# Patient Record
Sex: Male | Born: 1970 | Race: Black or African American | Hispanic: No | Marital: Married | Smoking: Current every day smoker
Health system: Southern US, Community
[De-identification: ages and names within clinical notes are randomized; demographics above are authoritative.]

## PROBLEM LIST (undated history)

## (undated) DIAGNOSIS — Z789 Other specified health status: Secondary | ICD-10-CM

## (undated) HISTORY — PX: NO PAST SURGERIES: SHX2092

---

## 2008-06-18 ENCOUNTER — Emergency Department (HOSPITAL_COMMUNITY): Admission: EM | Admit: 2008-06-18 | Discharge: 2008-06-18 | Payer: Self-pay | Admitting: Family Medicine

## 2008-08-18 ENCOUNTER — Emergency Department (HOSPITAL_COMMUNITY): Admission: EM | Admit: 2008-08-18 | Discharge: 2008-08-18 | Payer: Self-pay | Admitting: Emergency Medicine

## 2008-10-29 ENCOUNTER — Emergency Department (HOSPITAL_COMMUNITY): Admission: EM | Admit: 2008-10-29 | Discharge: 2008-10-29 | Payer: Self-pay | Admitting: Emergency Medicine

## 2008-11-01 ENCOUNTER — Emergency Department (HOSPITAL_COMMUNITY): Admission: EM | Admit: 2008-11-01 | Discharge: 2008-11-01 | Payer: Self-pay | Admitting: Emergency Medicine

## 2009-02-25 ENCOUNTER — Emergency Department (HOSPITAL_COMMUNITY): Admission: EM | Admit: 2009-02-25 | Discharge: 2009-02-25 | Payer: Self-pay | Admitting: Emergency Medicine

## 2009-03-22 ENCOUNTER — Ambulatory Visit: Payer: Self-pay | Admitting: Internal Medicine

## 2009-04-24 ENCOUNTER — Ambulatory Visit: Payer: Self-pay | Admitting: Internal Medicine

## 2009-04-24 LAB — CONVERTED CEMR LAB
Alkaline Phosphatase: 75 units/L (ref 39–117)
Amphetamine Screen, Ur: NEGATIVE
Barbiturate Quant, Ur: NEGATIVE
Basophils Absolute: 0 10*3/uL (ref 0.0–0.1)
Basophils Relative: 1 % (ref 0–1)
CO2: 23 meq/L (ref 19–32)
Cholesterol: 168 mg/dL (ref 0–200)
Cocaine Metabolites: NEGATIVE
Creatinine, Ser: 1.22 mg/dL (ref 0.40–1.50)
Eosinophils Absolute: 0.3 10*3/uL (ref 0.0–0.7)
Glucose, Bld: 98 mg/dL (ref 70–99)
LDL Cholesterol: 105 mg/dL — ABNORMAL HIGH (ref 0–99)
MCHC: 33.5 g/dL (ref 30.0–36.0)
MCV: 87.8 fL (ref 78.0–100.0)
Marijuana Metabolite: NEGATIVE
Methadone: NEGATIVE
Monocytes Absolute: 0.4 10*3/uL (ref 0.1–1.0)
Monocytes Relative: 7 % (ref 3–12)
Neutro Abs: 2.4 10*3/uL (ref 1.7–7.7)
Neutrophils Relative %: 39 % — ABNORMAL LOW (ref 43–77)
Opiate Screen, Urine: NEGATIVE
RBC: 4.83 M/uL (ref 4.22–5.81)
RDW: 13.6 % (ref 11.5–15.5)
Sodium: 138 meq/L (ref 135–145)
Total Bilirubin: 0.3 mg/dL (ref 0.3–1.2)
Total CHOL/HDL Ratio: 3.5
Triglycerides: 75 mg/dL (ref <150)
VLDL: 15 mg/dL (ref 0–40)

## 2009-06-07 ENCOUNTER — Emergency Department (HOSPITAL_COMMUNITY): Admission: EM | Admit: 2009-06-07 | Discharge: 2009-06-07 | Payer: Self-pay | Admitting: Emergency Medicine

## 2009-06-30 ENCOUNTER — Ambulatory Visit: Payer: Self-pay | Admitting: Internal Medicine

## 2009-07-04 ENCOUNTER — Ambulatory Visit: Payer: Self-pay | Admitting: *Deleted

## 2009-09-25 ENCOUNTER — Emergency Department (HOSPITAL_COMMUNITY): Admission: EM | Admit: 2009-09-25 | Discharge: 2009-09-25 | Payer: Self-pay | Admitting: Family Medicine

## 2009-10-13 ENCOUNTER — Ambulatory Visit: Payer: Self-pay | Admitting: Internal Medicine

## 2011-01-01 LAB — CBC
HCT: 45.8 % (ref 39.0–52.0)
Hemoglobin: 16.1 g/dL (ref 13.0–17.0)
MCHC: 35.1 g/dL (ref 30.0–36.0)
MCV: 87 fL (ref 78.0–100.0)
RDW: 13.3 % (ref 11.5–15.5)

## 2011-01-01 LAB — RAPID STREP SCREEN (MED CTR MEBANE ONLY): Streptococcus, Group A Screen (Direct): POSITIVE — AB

## 2011-01-01 LAB — DIFFERENTIAL
Basophils Absolute: 0.1 10*3/uL (ref 0.0–0.1)
Eosinophils Absolute: 0.2 10*3/uL (ref 0.0–0.7)
Eosinophils Relative: 2 % (ref 0–5)
Lymphocytes Relative: 21 % (ref 12–46)
Monocytes Absolute: 1 10*3/uL (ref 0.1–1.0)

## 2011-01-03 ENCOUNTER — Emergency Department (HOSPITAL_COMMUNITY)
Admission: EM | Admit: 2011-01-03 | Discharge: 2011-01-03 | Disposition: A | Discharge status: Home / Self Care | Payer: Self-pay | Attending: Emergency Medicine | Admitting: Emergency Medicine

## 2011-01-03 DIAGNOSIS — R0982 Postnasal drip: Secondary | ICD-10-CM | POA: Insufficient documentation

## 2011-01-03 DIAGNOSIS — J329 Chronic sinusitis, unspecified: Secondary | ICD-10-CM | POA: Insufficient documentation

## 2011-01-03 DIAGNOSIS — J3489 Other specified disorders of nose and nasal sinuses: Secondary | ICD-10-CM | POA: Insufficient documentation

## 2011-04-25 ENCOUNTER — Inpatient Hospital Stay (INDEPENDENT_AMBULATORY_CARE_PROVIDER_SITE_OTHER)
Admission: RE | Admit: 2011-04-25 | Discharge: 2011-04-25 | Disposition: A | Discharge status: Home / Self Care | Payer: Self-pay | Source: Ambulatory Visit | Attending: Emergency Medicine | Admitting: Emergency Medicine

## 2011-04-25 DIAGNOSIS — K089 Disorder of teeth and supporting structures, unspecified: Secondary | ICD-10-CM

## 2011-04-25 DIAGNOSIS — K069 Disorder of gingiva and edentulous alveolar ridge, unspecified: Secondary | ICD-10-CM

## 2011-08-17 ENCOUNTER — Encounter: Payer: Self-pay | Admitting: Emergency Medicine

## 2011-08-17 ENCOUNTER — Emergency Department (HOSPITAL_COMMUNITY)
Admission: EM | Admit: 2011-08-17 | Discharge: 2011-08-17 | Disposition: A | Discharge status: Home / Self Care | Payer: Self-pay | Attending: Emergency Medicine | Admitting: Emergency Medicine

## 2011-08-17 DIAGNOSIS — J029 Acute pharyngitis, unspecified: Secondary | ICD-10-CM | POA: Insufficient documentation

## 2011-08-17 DIAGNOSIS — J3489 Other specified disorders of nose and nasal sinuses: Secondary | ICD-10-CM | POA: Insufficient documentation

## 2011-08-17 DIAGNOSIS — R42 Dizziness and giddiness: Secondary | ICD-10-CM | POA: Insufficient documentation

## 2011-08-17 LAB — POCT I-STAT, CHEM 8
Creatinine, Ser: 1.2 mg/dL (ref 0.50–1.35)
Glucose, Bld: 97 mg/dL (ref 70–99)
HCT: 45 % (ref 39.0–52.0)
Hemoglobin: 15.3 g/dL (ref 13.0–17.0)
Sodium: 140 mEq/L (ref 135–145)
TCO2: 26 mmol/L (ref 0–100)

## 2011-08-17 LAB — CBC
Hemoglobin: 14.5 g/dL (ref 13.0–17.0)
MCH: 30.5 pg (ref 26.0–34.0)
MCHC: 34.8 g/dL (ref 30.0–36.0)
RDW: 12.9 % (ref 11.5–15.5)

## 2011-08-17 MED ORDER — IBUPROFEN 800 MG PO TABS
800.0000 mg | ORAL_TABLET | Freq: Once | ORAL | Status: AC
  Administered 2011-08-17: 800 mg via ORAL
  Filled 2011-08-17: qty 1

## 2011-08-17 MED ORDER — AMOXICILLIN 500 MG PO CAPS: 500.0000 mg | ORAL_CAPSULE | Freq: Three times a day (TID) | ORAL | Status: AC

## 2011-08-17 NOTE — ED Provider Notes (Signed)
Medical screening examination/treatment/procedure(s) were performed by non-physician practitioner and as supervising physician I was immediately available for consultation/collaboration.    Nelia Shi, MD 08/17/11 2028

## 2011-08-17 NOTE — ED Provider Notes (Signed)
History     CSN: [id number] Arrival date & time: 08/17/2011  9:52 AM   First MD Initiated Contact with Patient 08/17/11 1006      Chief Complaint  Patient presents with  . Sore Throat    (Consider location/radiation/quality/duration/timing/severity/associated sxs/prior treatment) HPI  Patient presents to emergency department complaining of a two-week history of gradual onset sore throat describing throat as painful and scratching with right-sided throat more painful than left. Patient states he has history of a "throat abscess" approximately 2 years ago that had to be "drained by ENT." Patient states this is his biggest concern that he could be "forming another abscess." Patient denies fevers, chills, headache, difficulty swallowing, earache, cough. Patient does note some sinus pressure and nasal drainage as well as a two-week history of intermittent lightheadedness and dizziness. Patient states he can be walking at work and get a sensation of "swimmy headedness." Patient denies syncope, loss of coordination, difficulty ambulating, visual changes, extremity weakness, extremity numbness or tingling, slurred speech. Patient has no known medical problems and takes no medicines on a regular basis. Patient states that with history of abscess he had high fevers and high degree of pain and denies any similar symptoms currently but states "my throat feels irritated where the abscess was."  History reviewed. No pertinent past medical history.  History reviewed. No pertinent past surgical history.  History reviewed. No pertinent family history.  History  Substance Use Topics  . Smoking status: Current Everyday Smoker  . Smokeless tobacco: Never Used  . Alcohol Use: Yes     seldom      Review of Systems  All other systems reviewed and are negative.    Allergies  Review of patient's allergies indicates no known allergies.  Home Medications   Current Outpatient Rx  Name Route Sig  Dispense Refill  . IBUPROFEN 200 MG PO TABS Oral Take 200 mg by mouth daily as needed. For pain.       BP 108/66  Pulse 80  Temp(Src) 97.9 F (36.6 C) (Oral)  Resp 18  SpO2 98%  Physical Exam  Vitals reviewed. Constitutional: He is oriented to person, place, and time. He appears well-developed and well-nourished. No distress.  HENT:  Head: Normocephalic and atraumatic.  Right Ear: External ear normal.  Left Ear: External ear normal.  Nose: Nose normal.  Mouth/Throat: No oropharyngeal exudate.       Mild erythema of posterior pharynx and tonsils no tonsillar exudate or enlargement. Patent airway. Swallowing secretions well. Posterior pharynx cobbling and drainage but uvula midline and no tonsillar swelling.   Eyes: Conjunctivae and EOM are normal. Pupils are equal, round, and reactive to light.  Neck: Normal range of motion. Neck supple.  Cardiovascular: Normal rate, regular rhythm and normal heart sounds.  Exam reveals no gallop and no friction rub.   No murmur heard. Pulmonary/Chest: Effort normal and breath sounds normal. No respiratory distress. He has no wheezes. He has no rales. He exhibits no tenderness.  Abdominal: Soft. He exhibits no distension and no mass. There is no tenderness. There is no rebound and no guarding.  Musculoskeletal: Normal range of motion.  Lymphadenopathy:    He has no cervical adenopathy.  Neurological: He is alert and oriented to person, place, and time. He has normal reflexes. No cranial nerve deficit. Coordination normal.  Skin: Skin is warm and dry. No rash noted. He is not diaphoretic.  Psychiatric: He has a normal mood and affect.    ED Course  Procedures (including critical care time)  PO ibuprofen  Reviewed prior CT with patient noting that his abscess was left sided and not right where patient states his current discomfort is located and patient agrees to remembering episode incorrectly and that peritonsillar abscess was on left. Again  denies fevers, chills, difficulty swallowing or severe pain.   Strep screen negative   Date: 08/17/2011  Rate: 51  Rhythm: sinus bradycardia and sinus arrhythmia  QRS Axis: normal  Intervals: normal  ST/T Wave abnormalities: normal  Conduction Disutrbances:none  Narrative Interpretation:   Old EKG Reviewed: none available    Labs Reviewed  RAPID STREP SCREEN  CBC  POCT I-STAT, CHEM 8  I-STAT, CHEM 8   No results found.   1. Pharyngitis   2. Dizziness       MDM  Strep screen negative with no signs or symptoms of peri tonsillar abscess with uvula midline, afebrile and non toxic appearing. neurovasc intact without any signs or symptoms of central vertigo and no ataxia or neurofocal findings. Question lightheadedness to be associated with sinus pressure and nasal congestion.         Jenness Corner, [place] 08/17/11 1205

## 2011-08-17 NOTE — ED Notes (Signed)
Pt reports sore throat and dizziness x 2 weeks.

## 2011-08-17 NOTE — ED Notes (Signed)
Pt has had sore throat x 2 weeks.  Pt has redness to same.  Pt states that he has had fever off and on.  Pt denies any fever at this time.

## 2013-04-09 ENCOUNTER — Encounter (HOSPITAL_COMMUNITY): Payer: Self-pay | Admitting: Emergency Medicine

## 2013-04-09 ENCOUNTER — Emergency Department (HOSPITAL_COMMUNITY): Payer: Self-pay

## 2013-04-09 ENCOUNTER — Emergency Department (HOSPITAL_COMMUNITY)
Admission: EM | Admit: 2013-04-09 | Discharge: 2013-04-10 | Disposition: A | Discharge status: Home / Self Care | Payer: Self-pay | Attending: Emergency Medicine | Admitting: Emergency Medicine

## 2013-04-09 DIAGNOSIS — Z79899 Other long term (current) drug therapy: Secondary | ICD-10-CM | POA: Insufficient documentation

## 2013-04-09 DIAGNOSIS — R112 Nausea with vomiting, unspecified: Secondary | ICD-10-CM | POA: Insufficient documentation

## 2013-04-09 DIAGNOSIS — R0602 Shortness of breath: Secondary | ICD-10-CM | POA: Insufficient documentation

## 2013-04-09 DIAGNOSIS — R05 Cough: Secondary | ICD-10-CM | POA: Insufficient documentation

## 2013-04-09 DIAGNOSIS — R059 Cough, unspecified: Secondary | ICD-10-CM | POA: Insufficient documentation

## 2013-04-09 DIAGNOSIS — R0789 Other chest pain: Secondary | ICD-10-CM | POA: Insufficient documentation

## 2013-04-09 DIAGNOSIS — F17200 Nicotine dependence, unspecified, uncomplicated: Secondary | ICD-10-CM | POA: Insufficient documentation

## 2013-04-09 DIAGNOSIS — R6883 Chills (without fever): Secondary | ICD-10-CM | POA: Insufficient documentation

## 2013-04-09 LAB — CBC WITH DIFFERENTIAL/PLATELET
HCT: 42.1 % (ref 39.0–52.0)
Hemoglobin: 14.9 g/dL (ref 13.0–17.0)
Lymphocytes Relative: 35 % (ref 12–46)
Lymphs Abs: 3.1 10*3/uL (ref 0.7–4.0)
Monocytes Absolute: 0.5 10*3/uL (ref 0.1–1.0)
Monocytes Relative: 6 % (ref 3–12)
Neutro Abs: 5.2 10*3/uL (ref 1.7–7.7)
WBC: 8.9 10*3/uL (ref 4.0–10.5)

## 2013-04-09 LAB — COMPREHENSIVE METABOLIC PANEL
BUN: 9 mg/dL (ref 6–23)
Calcium: 9.6 mg/dL (ref 8.4–10.5)
GFR calc Af Amer: 82 mL/min — ABNORMAL LOW (ref >90)
Glucose, Bld: 108 mg/dL — ABNORMAL HIGH (ref 70–99)
Sodium: 137 mEq/L (ref 135–145)
Total Protein: 7.5 g/dL (ref 6.0–8.3)

## 2013-04-09 MED ORDER — SODIUM CHLORIDE 0.9 % IV BOLUS (SEPSIS)
1000.0000 mL | Freq: Once | INTRAVENOUS | Status: AC
  Administered 2013-04-09: 1000 mL via INTRAVENOUS

## 2013-04-09 MED ORDER — POTASSIUM CHLORIDE CRYS ER 20 MEQ PO TBCR
40.0000 meq | EXTENDED_RELEASE_TABLET | Freq: Once | ORAL | Status: AC
  Administered 2013-04-09: 40 meq via ORAL
  Filled 2013-04-09: qty 2

## 2013-04-09 MED ORDER — MORPHINE SULFATE 4 MG/ML IJ SOLN
4.0000 mg | Freq: Once | INTRAMUSCULAR | Status: AC
  Administered 2013-04-09: 4 mg via INTRAVENOUS
  Filled 2013-04-09: qty 1

## 2013-04-09 MED ORDER — GI COCKTAIL ~~LOC~~
30.0000 mL | Freq: Once | ORAL | Status: AC
  Administered 2013-04-09: 30 mL via ORAL
  Filled 2013-04-09: qty 30

## 2013-04-09 MED ORDER — ONDANSETRON HCL 4 MG/2ML IJ SOLN
4.0000 mg | Freq: Once | INTRAMUSCULAR | Status: AC
  Administered 2013-04-09: 4 mg via INTRAVENOUS
  Filled 2013-04-09: qty 2

## 2013-04-09 NOTE — ED Notes (Signed)
[id number]<AV> Expected date:<BR> Expected time:<BR> Means of arrival:<BR> Comments:<BR> EMS/male ambulatory with indigestion

## 2013-04-09 NOTE — ED Notes (Addendum)
Per EMS: Pt reports shortness of breath and epigastric pain that began two weeks ago. Pt reports nausea, however denies diarrhea and emesis. Pt reports having left sided chest pain that does not radiate. Pt states he has had shortness of breath, dizziness, lightheadedness, diaphoresis, and weakness. Pt is a one pack-per-day smoker for the last 15 years. Pt denies history of COPD or asthma. Vital signs are stable and pt is A/O x4, respirations equal and unlabored.

## 2013-04-09 NOTE — ED Provider Notes (Signed)
CSN: [id number]     Arrival date & time 04/09/13  2056 History     First MD Initiated Contact with Patient 04/09/13 2101     Chief Complaint  Patient presents with  . Shortness of Breath  . Chest Pain   (Consider location/radiation/quality/duration/timing/severity/associated sxs/prior Treatment) HPI Comments: Patient is a 43 year old male past medical history significant for smoking presented to the emergency department for 2 weeks of worsening shortness of breath with associated left-sided intermittent sharp chest pains. Patient states he also has associated productive cough, chills, nausea, nonbilious nonbloody vomiting. Patient rates his pain 8/10 without alleviating or aggravating factors. Patient has no PCP and has never been evaluated for cardiac history. No known family cardiac history. PERC negative.    History reviewed. No pertinent past medical history. History reviewed. No pertinent past surgical history. No family history on file. History  Substance Use Topics  . Smoking status: Current Every Day Smoker -- 1.00 packs/day for 15 years    Types: Cigarettes  . Smokeless tobacco: Never Used  . Alcohol Use: Yes     Comment: seldom    Review of Systems  Constitutional: Positive for chills.  HENT: Negative.   Eyes: Negative.   Respiratory: Positive for cough and shortness of breath.   Cardiovascular: Positive for chest pain. Negative for leg swelling.  Gastrointestinal: Positive for nausea and vomiting. Negative for abdominal pain and diarrhea.  Genitourinary: Negative.   Musculoskeletal: Negative for back pain.  Skin: Negative.   Allergic/Immunologic: Negative.   Neurological: Negative for headaches.    Allergies  Review of patient's allergies indicates no known allergies.  Home Medications   Current Outpatient Rx  Name  Route  Sig  Dispense  Refill  . omeprazole (PRILOSEC) 20 MG capsule   Oral   Take 1 capsule (20 mg total) by mouth daily.   14 capsule    0    BP 110/58  Temp(Src) 98.1 F (36.7 C) (Oral)  Resp 20  SpO2 98% Physical Exam  Constitutional: He is oriented to person, place, and time. He appears well-developed and well-nourished. No distress.  HENT:  Head: Normocephalic and atraumatic.  Eyes: Conjunctivae are normal.  Neck: Neck supple.  Cardiovascular: Normal rate, regular rhythm, normal heart sounds and intact distal pulses.   Pulmonary/Chest: Effort normal and breath sounds normal.  Abdominal: Soft. Bowel sounds are normal. There is no tenderness.  Musculoskeletal: He exhibits no edema.  Neurological: He is alert and oriented to person, place, and time.  Skin: Skin is warm and dry. He is not diaphoretic.    ED Course   Procedures (including critical care time)  Medications  ondansetron (ZOFRAN) injection 4 mg (4 mg Intravenous Given 04/09/13 2131)  sodium chloride 0.9 % bolus 1,000 mL (0 mLs Intravenous Stopped 04/09/13 2324)  morphine 4 MG/ML injection 4 mg (4 mg Intravenous Given 04/09/13 2131)  potassium chloride SA (K-DUR,KLOR-CON) CR tablet 40 mEq (40 mEq Oral Given 04/09/13 2247)  gi cocktail (Maalox,Lidocaine,Donnatal) (30 mLs Oral Given 04/09/13 2324)     Date: 04/09/2013  Rate: 85  Rhythm: normal sinus rhythm  QRS Axis: left  Intervals: normal  ST/T Wave abnormalities: normal  Conduction Disutrbances:none  Narrative Interpretation:   Old EKG Reviewed: none available   Labs Reviewed  COMPREHENSIVE METABOLIC PANEL - Abnormal; Notable for the following:    Potassium 3.2 (*)    Glucose, Bld 108 (*)    GFR calc non Af Amer 70 (*)    GFR calc Af Denyse Dago  82 (*)    All other components within normal limits  TROPONIN I  CBC WITH DIFFERENTIAL  PRO B NATRIURETIC PEPTIDE  TROPONIN I   Dg Chest 2 View  04/09/2013   *RADIOLOGY REPORT*  Clinical Data: Chest pain, shortness of breath, cough, congestion, history of smoking  CHEST - 2 VIEW  Comparison: None.  Findings:  Borderline enlarged cardiac silhouette,  possibly accentuated due to AP projection and decreased lung volumes.  Normal mediastinal contours.  No focal airspace opacity.  No definite pleural effusion or pneumothorax.  No definite evidence of edema. No acute osseous abnormality.  IMPRESSION:  Borderline cardiomegaly and hypoventilation without definite acute cardiopulmonary disease.   Original Report Authenticated By: Tacey Ruiz, MD   1. Chest pain, atypical     MDM  Patient is to be discharged with recommendation to follow up with PCP in regards to today's hospital visit. Chest pain is not likely of cardiac or pulmonary etiology d/t presentation, perc negative, VSS, no tracheal deviation, no JVD or new murmur, RRR, breath sounds equal bilaterally, EKG without acute abnormalities, two negative troponins, and negative CXR. Pt has been advised start a PPI and return to the ED is CP becomes exertional, associated with diaphoresis or nausea, radiates to left jaw/arm, worsens or becomes concerning in any way. Pt appears reliable for follow up and is agreeable to discharge. Case has been discussed with and seen by Dr. Fredderick Phenix who agrees with the above plan to discharge. Patient is stable at time of discharge        Jeannetta Ellis, PA-C 04/10/13 1610

## 2013-04-10 MED ORDER — OMEPRAZOLE 20 MG PO CPDR: 20.0000 mg | DELAYED_RELEASE_CAPSULE | Freq: Every day | ORAL | Status: DC

## 2013-04-10 NOTE — ED Provider Notes (Signed)
Medical screening examination/treatment/procedure(s) were performed by non-physician practitioner and as supervising physician I was immediately available for consultation/collaboration.   Rolan Bucco, MD 04/10/13 [phone]

## 2016-04-03 ENCOUNTER — Encounter (HOSPITAL_COMMUNITY): Payer: Self-pay | Admitting: Family Medicine

## 2016-04-03 ENCOUNTER — Ambulatory Visit (HOSPITAL_COMMUNITY)
Admission: EM | Admit: 2016-04-03 | Discharge: 2016-04-03 | Disposition: A | Discharge status: Nursing Home (Includes ICF) | Payer: Self-pay | Attending: Emergency Medicine | Admitting: Emergency Medicine

## 2016-04-03 ENCOUNTER — Emergency Department (HOSPITAL_COMMUNITY)
Admission: EM | Admit: 2016-04-03 | Discharge: 2016-04-03 | Disposition: A | Discharge status: Home / Self Care | Payer: Self-pay | Attending: Dermatology | Admitting: Dermatology

## 2016-04-03 ENCOUNTER — Encounter (HOSPITAL_COMMUNITY): Payer: Self-pay | Admitting: Emergency Medicine

## 2016-04-03 DIAGNOSIS — Z5321 Procedure and treatment not carried out due to patient leaving prior to being seen by health care provider: Secondary | ICD-10-CM | POA: Insufficient documentation

## 2016-04-03 DIAGNOSIS — Y939 Activity, unspecified: Secondary | ICD-10-CM | POA: Insufficient documentation

## 2016-04-03 DIAGNOSIS — W109XXA Fall (on) (from) unspecified stairs and steps, initial encounter: Secondary | ICD-10-CM

## 2016-04-03 DIAGNOSIS — Y929 Unspecified place or not applicable: Secondary | ICD-10-CM | POA: Insufficient documentation

## 2016-04-03 DIAGNOSIS — M542 Cervicalgia: Secondary | ICD-10-CM

## 2016-04-03 DIAGNOSIS — W108XXA Fall (on) (from) other stairs and steps, initial encounter: Secondary | ICD-10-CM

## 2016-04-03 DIAGNOSIS — S0990XA Unspecified injury of head, initial encounter: Secondary | ICD-10-CM | POA: Insufficient documentation

## 2016-04-03 DIAGNOSIS — R4020 Unspecified coma: Secondary | ICD-10-CM

## 2016-04-03 DIAGNOSIS — F17210 Nicotine dependence, cigarettes, uncomplicated: Secondary | ICD-10-CM | POA: Insufficient documentation

## 2016-04-03 DIAGNOSIS — Y999 Unspecified external cause status: Secondary | ICD-10-CM | POA: Insufficient documentation

## 2016-04-03 DIAGNOSIS — M545 Low back pain: Secondary | ICD-10-CM

## 2016-04-03 DIAGNOSIS — W1789XA Other fall from one level to another, initial encounter: Secondary | ICD-10-CM | POA: Insufficient documentation

## 2016-04-03 DIAGNOSIS — R404 Transient alteration of awareness: Secondary | ICD-10-CM

## 2016-04-03 MED ORDER — HYDROCODONE-ACETAMINOPHEN 5-325 MG PO TABS
ORAL_TABLET | ORAL | Status: AC
  Filled 2016-04-03: qty 1

## 2016-04-03 MED ORDER — HYDROCODONE-ACETAMINOPHEN 5-325 MG PO TABS
1.0000 | ORAL_TABLET | Freq: Once | ORAL | Status: AC
  Administered 2016-04-03: 1 via ORAL

## 2016-04-03 NOTE — ED Notes (Signed)
[facility] with a complaint of aback and neck pain secondary to falling about 4 feet off of a non-moving truck and landing on his back. The patient stated that he fell last night and he stated that he did pass out for about 3 to 4 seconds.

## 2016-04-03 NOTE — ED Provider Notes (Signed)
CSN: [id number]     Arrival date & time 04/03/16  1214 History   First MD Initiated Contact with Patient 04/03/16 1246     Chief Complaint  Patient presents with  . Fall  . Back Pain   (Consider location/radiation/quality/duration/timing/severity/associated sxs/prior Treatment) HPI He is a 45 year old man here for evaluation of head injury and back pain after a fall. He works for a [employer] and fell last night at work. He states he was coming down the steps from the truck, approximately 4 feet off the ground when his foot slipped and he fell backwards. He states he landed on his back on the ground and then his head hit. He states he blacked out for a few seconds. He reports having a large goose egg on the back of his head that has gone down with icing. He has had persistent blurred vision and nausea today. He also reports an episode of intense dizziness this morning. No vomiting. No difficulty with speech or swallowing. No focal numbness, tingling, or weakness.  He also reports pain from the top of his neck all the way down to his lower back. He states it is right over his spine. No radicular pain.  History reviewed. No pertinent past medical history. History reviewed. No pertinent past surgical history. History reviewed. No pertinent family history. Social History  Substance Use Topics  . Smoking status: Current Every Day Smoker -- 1.00 packs/day for 15 years    Types: Cigarettes  . Smokeless tobacco: Never Used  . Alcohol Use: Yes     Comment: seldom    Review of Systems As in history of present illness Allergies  Review of patient's allergies indicates no known allergies.  Home Medications   Prior to Admission medications   Medication Sig Start Date End Date Taking? Authorizing Provider  omeprazole (PRILOSEC) 20 MG capsule Take 1 capsule (20 mg total) by mouth daily. 04/10/13   Francee PiccoloJennifer Piepenbrink, PA-C   Meds Ordered and Administered this Visit   Medications   HYDROcodone-acetaminophen (NORCO/VICODIN) 5-325 MG per tablet 1 tablet (not administered)    BP 130/83 mmHg  Pulse 67  Temp(Src) 98.4 F (36.9 C) (Oral)  Resp 20  SpO2 99% No data found.   Physical Exam  Constitutional: He is oriented to person, place, and time. He appears well-developed and well-nourished. No distress.  Eyes: EOM are normal. Pupils are equal, round, and reactive to light.  Neck:  Full range of motion. He is tender directly over the cervical spine.  Cardiovascular: Normal rate.   Pulmonary/Chest: Effort normal.  Musculoskeletal:  Back: No erythema or edema. He is diffusely tender along the thoracic and lumbar spines. Minimal muscular tenderness. 5 out of 5 strength in all extremities.  Neurological: He is alert and oriented to person, place, and time. No cranial nerve deficit. He exhibits normal muscle tone. Coordination normal.    ED Course  Procedures (including critical care time)  Labs Review Labs Reviewed - No data to display  Imaging Review No results found.   MDM   1. Head injury, initial encounter   2. LOC (loss of consciousness)   3. Fall from steps, initial encounter   4. Neck pain   5. Midline low back pain, with sciatica presence unspecified    Neurologic exam is normal. However, with his continued blurred vision, nausea, and dizziness, will transfer to the emergency room for additional evaluation of head injury with loss of consciousness. I doubt he has any vertebral pathology with  lack of radicular symptoms, but will also likely need x-rays.    Charm Rings, MD 04/03/16 [phone]

## 2016-04-03 NOTE — ED Notes (Signed)
Patient states "I have things to do.  I can't wait here until 9 tonight".

## 2016-04-03 NOTE — ED Notes (Signed)
Urgent care sent because patient may need CT.  Patient fell 4 ft and landed on back yesterday.   Patient complained that he did have LOC, and is now having neck/back pain.

## 2016-04-03 NOTE — ED Notes (Signed)
Pt here for head pain, LOC and neck pain after a fall out of his work truck last night. [facility]. sts some nausea.

## 2016-08-19 ENCOUNTER — Encounter (HOSPITAL_COMMUNITY): Payer: Self-pay | Admitting: Emergency Medicine

## 2016-08-19 DIAGNOSIS — K2970 Gastritis, unspecified, without bleeding: Secondary | ICD-10-CM | POA: Insufficient documentation

## 2016-08-19 DIAGNOSIS — F17210 Nicotine dependence, cigarettes, uncomplicated: Secondary | ICD-10-CM | POA: Insufficient documentation

## 2016-08-19 LAB — COMPREHENSIVE METABOLIC PANEL
ALT: 21 U/L (ref 17–63)
AST: 21 U/L (ref 15–41)
Albumin: 3.9 g/dL (ref 3.5–5.0)
Alkaline Phosphatase: 52 U/L (ref 38–126)
Anion gap: 9 (ref 5–15)
BUN: 11 mg/dL (ref 6–20)
CHLORIDE: 103 mmol/L (ref 101–111)
CO2: 23 mmol/L (ref 22–32)
CREATININE: 1.15 mg/dL (ref 0.61–1.24)
Calcium: 9.3 mg/dL (ref 8.9–10.3)
GFR calc non Af Amer: >60 mL/min (ref >60)
Glucose, Bld: 139 mg/dL — ABNORMAL HIGH (ref 65–99)
POTASSIUM: 4.2 mmol/L (ref 3.5–5.1)
SODIUM: 135 mmol/L (ref 135–145)
Total Bilirubin: 0.7 mg/dL (ref 0.3–1.2)
Total Protein: 7.1 g/dL (ref 6.5–8.1)

## 2016-08-19 LAB — URINALYSIS, ROUTINE W REFLEX MICROSCOPIC
Bilirubin Urine: NEGATIVE
GLUCOSE, UA: NEGATIVE mg/dL
HGB URINE DIPSTICK: NEGATIVE
Ketones, ur: NEGATIVE mg/dL
Leukocytes, UA: NEGATIVE
Nitrite: NEGATIVE
PH: 6.5 (ref 5.0–8.0)
PROTEIN: NEGATIVE mg/dL
SPECIFIC GRAVITY, URINE: 1.017 (ref 1.005–1.030)

## 2016-08-19 LAB — CBC
HEMATOCRIT: 41.8 % (ref 39.0–52.0)
Hemoglobin: 14.4 g/dL (ref 13.0–17.0)
MCH: 29.6 pg (ref 26.0–34.0)
MCHC: 34.4 g/dL (ref 30.0–36.0)
MCV: 86 fL (ref 78.0–100.0)
PLATELETS: 275 10*3/uL (ref 150–400)
RBC: 4.86 MIL/uL (ref 4.22–5.81)
RDW: 13.2 % (ref 11.5–15.5)
WBC: 7.6 10*3/uL (ref 4.0–10.5)

## 2016-08-19 LAB — LIPASE, BLOOD: LIPASE: 17 U/L (ref 11–51)

## 2016-08-19 NOTE — ED Triage Notes (Signed)
Pt. reports upper abdominal pain with emesis and diarrhea onset 4 days ago , denies fever or chills .

## 2016-08-20 ENCOUNTER — Emergency Department (HOSPITAL_COMMUNITY)
Admission: EM | Admit: 2016-08-20 | Discharge: 2016-08-20 | Disposition: A | Discharge status: Home / Self Care | Payer: Self-pay | Attending: Emergency Medicine | Admitting: Emergency Medicine

## 2016-08-20 DIAGNOSIS — K2970 Gastritis, unspecified, without bleeding: Secondary | ICD-10-CM

## 2016-08-20 MED ORDER — ONDANSETRON HCL 4 MG PO TABS: 4.0000 mg | ORAL_TABLET | Freq: Four times a day (QID) | ORAL | 0 refills | Status: DC

## 2016-08-20 MED ORDER — ONDANSETRON 4 MG PO TBDP
4.0000 mg | ORAL_TABLET | Freq: Once | ORAL | Status: AC
Start: 2016-08-20 — End: 2016-08-20
  Administered 2016-08-20: 4 mg via ORAL
  Filled 2016-08-20: qty 1

## 2016-08-20 MED ORDER — GI COCKTAIL ~~LOC~~
30.0000 mL | Freq: Once | ORAL | Status: AC
  Administered 2016-08-20: 30 mL via ORAL
  Filled 2016-08-20: qty 30

## 2016-08-20 MED ORDER — HYDROCODONE-ACETAMINOPHEN 5-325 MG PO TABS
1.0000 | ORAL_TABLET | Freq: Once | ORAL | Status: AC
  Administered 2016-08-20: 1 via ORAL
  Filled 2016-08-20: qty 1

## 2016-08-20 MED ORDER — OMEPRAZOLE 20 MG PO CPDR: 20.0000 mg | DELAYED_RELEASE_CAPSULE | Freq: Every day | ORAL | 0 refills | Status: DC

## 2016-08-20 NOTE — ED Provider Notes (Addendum)
[facility]-EMERGENCY DEPT Provider Note   CSN: [id number] Arrival date & time: 08/19/16  2150  History   Chief Complaint Chief Complaint  Patient presents with  . Abdominal Pain   HPI Danny Montes is a 45 y.o. male.  HPI   Patient to the ER denies any significant PMH comes to the ER with epigastric pain N and V. He reports having a cold over a week ago and 5 days ago he decided to drink some 100 proof bootleg liquor on an empty stomach. The next day he had epigastric pain and an episode of diarrhea, he has had diarrhea x 1 each day for the past 4 days. Today he had one episode of vomiting. His pain is not severe but described as burning. He has not tried any further medication.   History reviewed. No pertinent past medical history.  There are no active problems to display for this patient.   History reviewed. No pertinent surgical history.     Home Medications    Prior to Admission medications   Medication Sig Start Date End Date Taking? Authorizing Provider  omeprazole (PRILOSEC) 20 MG capsule Take 1 capsule (20 mg total) by mouth daily. Patient not taking: Reported on 08/20/2016 04/10/13   Francee PiccoloJennifer Piepenbrink, PA-C  omeprazole (PRILOSEC) 20 MG capsule Take 1 capsule (20 mg total) by mouth daily. 08/20/16   Nyasha Rahilly Neva SeatGreene, PA-C  ondansetron (ZOFRAN) 4 MG tablet Take 1 tablet (4 mg total) by mouth every 6 (six) hours. 08/20/16   Marlon Peliffany Jemarion Roycroft, PA-C    Family History No family history on file.  Social History Social History  Substance Use Topics  . Smoking status: Current Every Day Smoker    Packs/day: 0.00    Years: 0.00    Types: Cigarettes  . Smokeless tobacco: Never Used  . Alcohol use Yes     Comment: OCCASIONAL     Allergies   Patient has no known allergies.   Review of Systems Review of Systems  Review of Systems All other systems negative except as documented in the HPI. All pertinent positives and negatives as reviewed in the HPI.   Physical  Exam Updated Vital Signs BP 122/82   Pulse 77   Temp 98.1 F (36.7 C) (Oral)   Resp 16   Ht 5\' 5"  (1.651 m)   Wt 117.9 kg   SpO2 98%   BMI 43.27 kg/m   Physical Exam  Constitutional: He appears well-developed and well-nourished. No distress.  HENT:  Head: Normocephalic and atraumatic.  Right Ear: Tympanic membrane and ear canal normal.  Left Ear: Tympanic membrane and ear canal normal.  Nose: Nose normal.  Mouth/Throat: Uvula is midline, oropharynx is clear and moist and mucous membranes are normal.  Eyes: Pupils are equal, round, and reactive to light.  Neck: Normal range of motion. Neck supple.  Cardiovascular: Normal rate and regular rhythm.   Pulmonary/Chest: Effort normal.  Abdominal: Soft.  No signs of abdominal distention  Musculoskeletal:  No LE swelling  Neurological: He is alert.  Acting at baseline  Skin: Skin is warm and dry. No rash noted.  Nursing note and vitals reviewed.    ED Treatments / Results  Labs (all labs ordered are listed, but only abnormal results are displayed) Labs Reviewed  COMPREHENSIVE METABOLIC PANEL - Abnormal; Notable for the following:       Result Value   Glucose, Bld 139 (*)    All other components within normal limits  LIPASE, BLOOD  CBC  URINALYSIS, ROUTINE W REFLEX MICROSCOPIC (NOT AT [facility])    EKG  EKG Interpretation None       Radiology No results found.  Procedures Procedures (including critical care time)  Medications Ordered in ED Medications  gi cocktail (Maalox,Lidocaine,Donnatal) (30 mLs Oral Given 08/20/16 0055)  HYDROcodone-acetaminophen (NORCO/VICODIN) 5-325 MG per tablet 1 tablet (1 tablet Oral Given 08/20/16 0055)  ondansetron (ZOFRAN-ODT) disintegrating tablet 4 mg (4 mg Oral Given 08/20/16 0055)     Initial Impression / Assessment and Plan / ED Course  I have reviewed the triage vital signs and the nursing notes.  Pertinent labs & imaging results that were available during my care of the  patient were reviewed by me and considered in my medical decision making (see chart for details).  Clinical Course     Danny Coveerpetue E Obama, RN  08/20/2016 02:45    Pt provided crackers & po fluids. Tolerating both well. Showing NAD      Patient with symptoms consistent with gastritis.  Vitals are stable, no fever.  No signs of dehydration, tolerating PO fluids > 6 oz.  Lungs are clear.  No focal abdominal pain, no concern for appendicitis, cholecystitis, pancreatitis, ruptured viscus, UTI, kidney stone, or any other abdominal etiology.  Supportive therapy indicated with return if symptoms worsen.  Patient counseled.   Final Clinical Impressions(s) / ED Diagnoses   Final diagnoses:  Gastritis without bleeding, unspecified chronicity, unspecified gastritis type    New Prescriptions New Prescriptions   OMEPRAZOLE (PRILOSEC) 20 MG CAPSULE    Take 1 capsule (20 mg total) by mouth daily.   ONDANSETRON (ZOFRAN) 4 MG TABLET    Take 1 tablet (4 mg total) by mouth every 6 (six) hours.     Marlon Peliffany Islam Eichinger, PA-C 08/20/16 [phone]    Tomasita CrumbleAdeleke Oni, MD 08/20/16 [phone]    Marlon Peliffany Jaylin Benzel, PA-C 10/10/16 1515    Tomasita CrumbleAdeleke Oni, MD 10/10/16 1714

## 2016-08-20 NOTE — ED Notes (Signed)
Pt provided crackers & po fluids. Tolerating both well. Showing NAD.

## 2018-03-09 ENCOUNTER — Other Ambulatory Visit: Payer: Self-pay

## 2018-03-09 ENCOUNTER — Emergency Department (HOSPITAL_COMMUNITY)
Admission: EM | Admit: 2018-03-09 | Discharge: 2018-03-09 | Disposition: A | Discharge status: Home / Self Care | Payer: BLUE CROSS/BLUE SHIELD | Attending: Emergency Medicine | Admitting: Emergency Medicine

## 2018-03-09 ENCOUNTER — Emergency Department (HOSPITAL_COMMUNITY): Payer: BLUE CROSS/BLUE SHIELD

## 2018-03-09 ENCOUNTER — Encounter (HOSPITAL_COMMUNITY): Payer: Self-pay | Admitting: Emergency Medicine

## 2018-03-09 DIAGNOSIS — J0120 Acute ethmoidal sinusitis, unspecified: Secondary | ICD-10-CM

## 2018-03-09 DIAGNOSIS — R42 Dizziness and giddiness: Secondary | ICD-10-CM | POA: Diagnosis not present

## 2018-03-09 DIAGNOSIS — F17210 Nicotine dependence, cigarettes, uncomplicated: Secondary | ICD-10-CM | POA: Diagnosis not present

## 2018-03-09 DIAGNOSIS — R0981 Nasal congestion: Secondary | ICD-10-CM | POA: Diagnosis present

## 2018-03-09 MED ORDER — AMOXICILLIN-POT CLAVULANATE 875-125 MG PO TABS: 1.0000 | ORAL_TABLET | Freq: Two times a day (BID) | ORAL | 0 refills | Status: DC

## 2018-03-09 MED ORDER — MECLIZINE HCL 12.5 MG PO TABS
25.0000 mg | ORAL_TABLET | Freq: Once | ORAL | Status: AC
  Administered 2018-03-09: 25 mg via ORAL
  Filled 2018-03-09: qty 2

## 2018-03-09 MED ORDER — LORAZEPAM 0.5 MG PO TABS
0.5000 mg | ORAL_TABLET | Freq: Once | ORAL | Status: AC
  Administered 2018-03-09: 0.5 mg via ORAL
  Filled 2018-03-09: qty 1

## 2018-03-09 MED ORDER — MECLIZINE HCL 25 MG PO TABS: 25.0000 mg | ORAL_TABLET | Freq: Four times a day (QID) | ORAL | 0 refills | Status: DC | PRN

## 2018-03-09 NOTE — ED Provider Notes (Signed)
[facility] EMERGENCY DEPARTMENT Provider Note   CSN: [id number] Arrival date & time: 03/09/18  0847     History   Chief Complaint Chief Complaint  Patient presents with  . Nasal Congestion    HPI Danny Montes is a 47 y.o. male with no significant medical history presenting with a 2 day history of right frontal sinus pain in association with nasal congestion but no nasal discharge but notes having dizziness with positional changes as well.  He describes a transient room movement sensation with head movement and positional changes, he denies fevers or chills, vision changes or ear pain, denies photophobia or phonophobia, no nausea, vomiting, fevers or chills.  He has been using NyQuil and using Mucinex nasal spray without symptom relief.  The history is provided by the patient.    History reviewed. No pertinent past medical history.  There are no active problems to display for this patient.   History reviewed. No pertinent surgical history.      Home Medications    Prior to Admission medications   Medication Sig Start Date End Date Taking? Authorizing Provider  guaiFENesin (MUCINEX) 600 MG 12 hr tablet Take 600 mg by mouth 2 (two) times daily as needed for cough or to loosen phlegm.   Yes [provider]  pseudoephedrine-acetaminophen (TYLENOL SINUS) 30-500 MG TABS tablet Take 1 tablet by mouth every 4 (four) hours as needed.   Yes [provider]  amoxicillin-clavulanate (AUGMENTIN) 875-125 MG tablet Take 1 tablet by mouth every 12 (twelve) hours. 03/09/18   Burgess Amor, PA-C  meclizine (ANTIVERT) 25 MG tablet Take 1 tablet (25 mg total) by mouth every 6 (six) hours as needed for dizziness. 03/09/18   Burgess Amor, PA-C    Family History History reviewed. No pertinent family history.  Social History Social History   Tobacco Use  . Smoking status: Current Every Day Smoker    Packs/day: 0.00    Years: 0.00    Pack years: 0.00    Types: Cigarettes    . Smokeless tobacco: Never Used  Substance Use Topics  . Alcohol use: Yes    Comment: OCCASIONAL  . Drug use: No     Allergies   Patient has no known allergies.   Review of Systems Review of Systems  Constitutional: Negative for chills and fever.  HENT: Positive for congestion, sinus pressure and sinus pain. Negative for ear discharge, ear pain, facial swelling, rhinorrhea, sore throat, trouble swallowing and voice change.   Eyes: Negative.  Negative for discharge.  Respiratory: Negative for cough, chest tightness, shortness of breath, wheezing and stridor.   Cardiovascular: Negative for chest pain.  Gastrointestinal: Negative for abdominal pain, nausea and vomiting.  Genitourinary: Negative.   Musculoskeletal: Negative for arthralgias, joint swelling and neck pain.  Skin: Negative.  Negative for rash and wound.  Neurological: Positive for dizziness. Negative for syncope, facial asymmetry, speech difficulty, weakness, light-headedness, numbness and headaches.  Psychiatric/Behavioral: Negative.      Physical Exam Updated Vital Signs BP (!) 150/100 (BP Location: Right Arm)   Pulse 76   Temp 97.9 F (36.6 C) (Oral)   Resp 17   Ht 5\' 5"  (1.651 m)   Wt 117.9 kg (260 lb)   SpO2 97%   BMI 43.27 kg/m   Physical Exam  Constitutional: He is oriented to person, place, and time. He appears well-developed and well-nourished.  HENT:  Head: Normocephalic and atraumatic.  Right Ear: Tympanic membrane normal. No mastoid tenderness. No middle ear  effusion.  Left Ear: Tympanic membrane normal. No mastoid tenderness.  No middle ear effusion.  Nose: Mucosal edema present. No rhinorrhea. Right sinus exhibits frontal sinus tenderness.  Eyes: Pupils are equal, round, and reactive to light. Conjunctivae and EOM are normal. Right eye exhibits no nystagmus. Left eye exhibits no nystagmus.  Neck: Normal range of motion. Neck supple. No spinous process tenderness present. No neck rigidity.  Normal range of motion present.  Cardiovascular: Normal rate, regular rhythm, normal heart sounds and intact distal pulses.  Pulmonary/Chest: Effort normal and breath sounds normal. He has no wheezes.  Musculoskeletal: Normal range of motion.  Neurological: He is alert and oriented to person, place, and time. He displays normal reflexes. No cranial nerve deficit or sensory deficit. He exhibits normal muscle tone. Coordination normal.  No pronator drift.  Grips equal.  Skin: Skin is warm and dry.  Psychiatric: He has a normal mood and affect.  Nursing note and vitals reviewed.    ED Treatments / Results  Labs (all labs ordered are listed, but only abnormal results are displayed) Labs Reviewed - No data to display  EKG None  Radiology Ct Head Wo Contrast  Result Date: 03/09/2018 CLINICAL DATA:  Headache and dizziness. Right sinus pressure for 2 days. EXAM: CT HEAD WITHOUT CONTRAST TECHNIQUE: Contiguous axial images were obtained from the base of the skull through the vertex without intravenous contrast. COMPARISON:  None. FINDINGS: Brain: No subdural, epidural, or subarachnoid hemorrhage. Ventricles and sulci are normal. Cerebellum, brainstem, and basal cisterns are normal. No mass effect or midline shift. No acute cortical ischemia or infarct. Vascular: No hyperdense vessel or unexpected calcification. Skull: Normal. Negative for fracture or focal lesion. Sinuses/Orbits: There is opacification of right-sided ethmoid air cells. The right maxillary sinus is not well evaluated. However, there may be some mild opacification superiorly and there is some associated wall thickening suggesting underlying chronic sinus disease. The visualized paranasal sinuses are otherwise normal. Mastoid air cells and middle ears are otherwise normal. Other: None. IMPRESSION: 1. Only the superior most aspect of the right maxillary sinus is seen on this study. There may be opacification in the superior right-sided  maxillary sinus with associated wall thickening suggesting chronic underlying right maxillary sinus disease. There is opacification of right-sided ethmoid air cells as well. 2. No acute intracranial abnormality noted. Electronically Signed   By: Gerome Sam III M.D   On: 03/09/2018 10:12    Procedures Procedures (including critical care time)  Medications Ordered in ED Medications  meclizine (ANTIVERT) tablet 25 mg (25 mg Oral Given 03/09/18 0944)  LORazepam (ATIVAN) tablet 0.5 mg (0.5 mg Oral Given 03/09/18 0944)     Initial Impression / Assessment and Plan / ED Course  I have reviewed the triage vital signs and the nursing notes.  Pertinent labs & imaging results that were available during my care of the patient were reviewed by me and considered in my medical decision making (see chart for details).     Patient was given meclizine and Ativan with complete resolution of the vertigo type symptoms.  CT imaging was negative for acute intracranial pathology.  He does have suggestion of right ethmoid sinusitis.  I suspect his vertigo is secondary to acute sinusitis.  He was prescribed additional meclizine, also started on Augmentin.  Home care discussed, including avoiding NyQuil as is probably elevating his blood pressure, recommended Coricidin products which would be safer.  Plan follow-up with his PCP in 1 week if symptoms persist, returning here  for any worsening symptoms.  Patient was normotensive prior to discharge.  Final Clinical Impressions(s) / ED Diagnoses   Final diagnoses:  Acute ethmoidal sinusitis, recurrence not specified  Dizziness    ED Discharge Orders        Ordered    amoxicillin-clavulanate (AUGMENTIN) 875-125 MG tablet  Every 12 hours     03/09/18 1131    meclizine (ANTIVERT) 25 MG tablet  Every 6 hours PRN     03/09/18 1131       Burgess Amordol, [place] Moskowitz, PA-C 03/09/18 1133    Samuel JesterMcManus, Kathleen, DO 03/14/18 1333

## 2018-03-09 NOTE — Discharge Instructions (Addendum)
Take the entire course of the antibiotics prescribed.  You may use meclizine if needed for dizziness.  Plan to follow-up with your doctor in 1 week for recheck if your symptoms are not improving.  I recommend using Coricidin brand products for any symptoms you may have including your current symptoms as these will not affect your blood pressure.  NyQuil may elevate your blood pressure and I do not recommend you taking this medication.

## 2018-03-09 NOTE — ED Triage Notes (Signed)
Pt c/o "sinus infection" x 2 days. States has a lot of pressure around right eye with mild coughing and runny nose.

## 2018-12-31 IMAGING — CT CT HEAD W/O CM
3 series · 15 of 47 positions shown, 18 images · non-contrast
Comparison: None.

CLINICAL DATA: Headache and dizziness. Right sinus pressure for 2
days.

EXAM:
CT HEAD WITHOUT CONTRAST
TECHNIQUE: Contiguous axial images were obtained from the base of the skull
through the vertex without intravenous contrast.

[Series 2: head wo · axial · 0.47mm/px · z∈[+104,+234]mm · 9 of 32 slices shown, 12 images]
[im 3/32  brain]
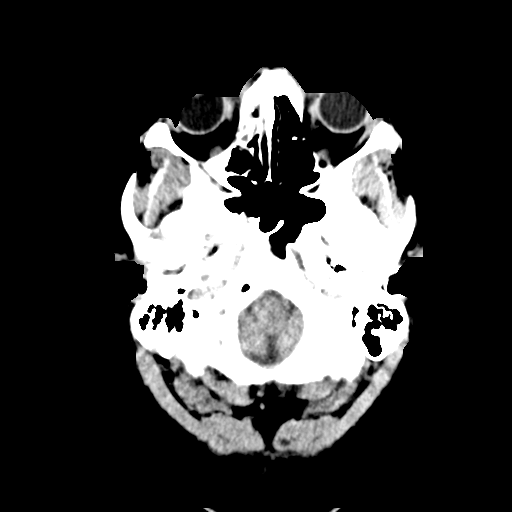
[im 3/32  bone]
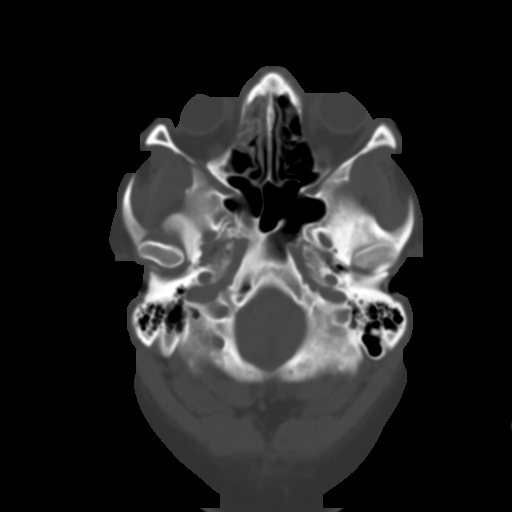
[im 6/32  brain]
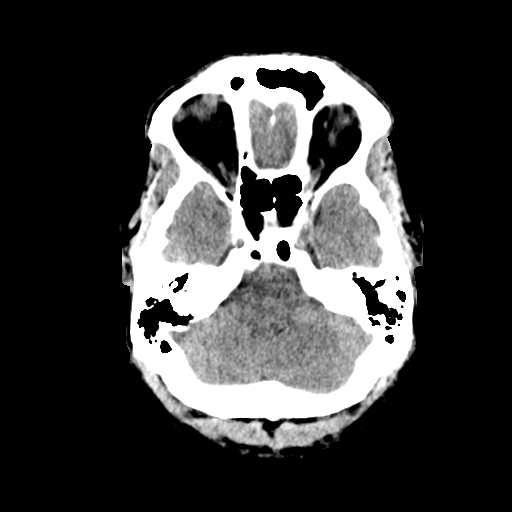
[im 9/32  brain]
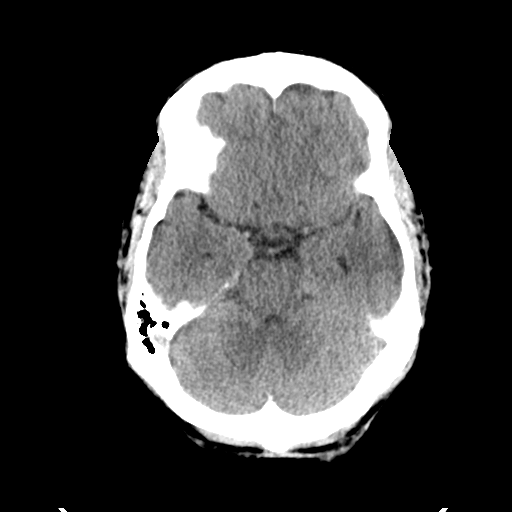
[im 12/32  brain]
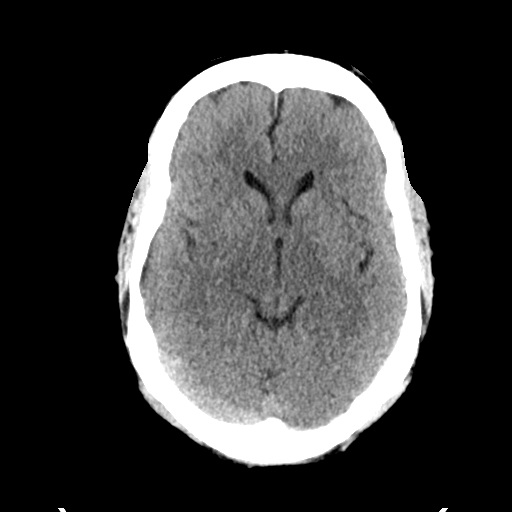
[im 17/32  brain]
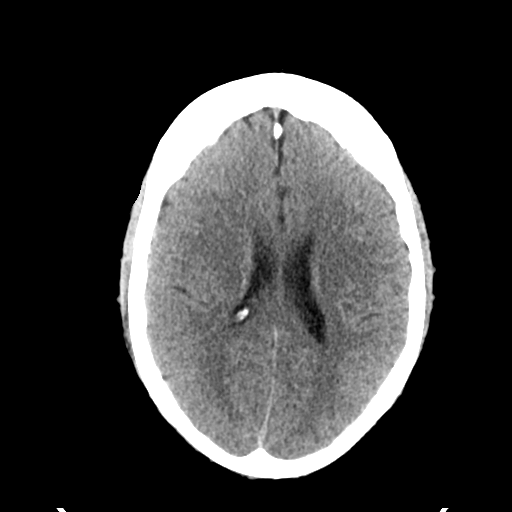
[im 17/32  bone]
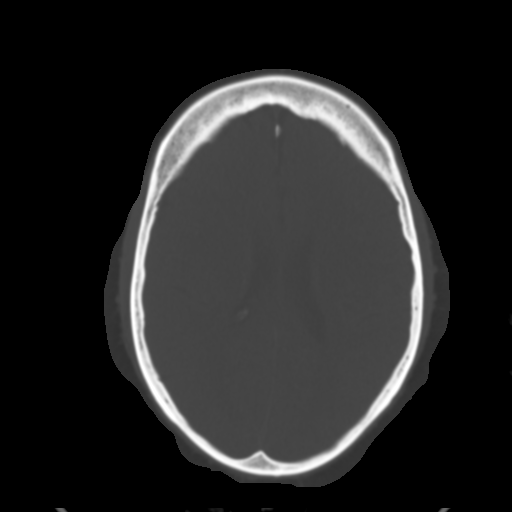
[im 20/32  brain]
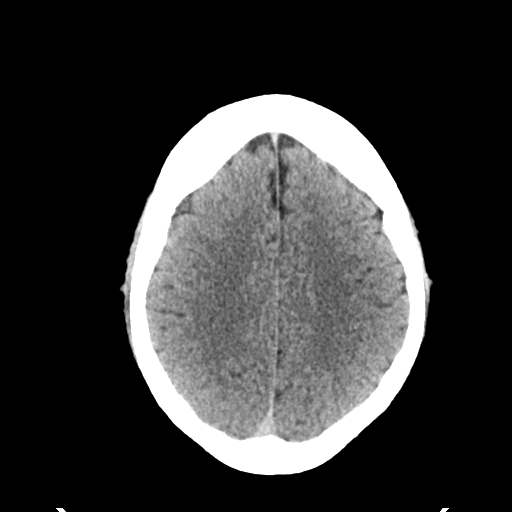
[im 23/32  brain]
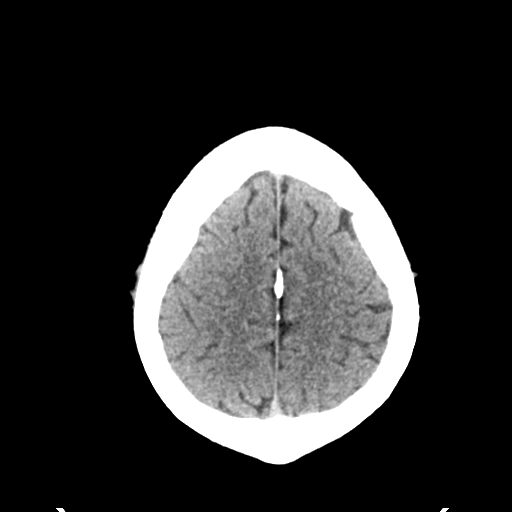
[im 26/32  brain]
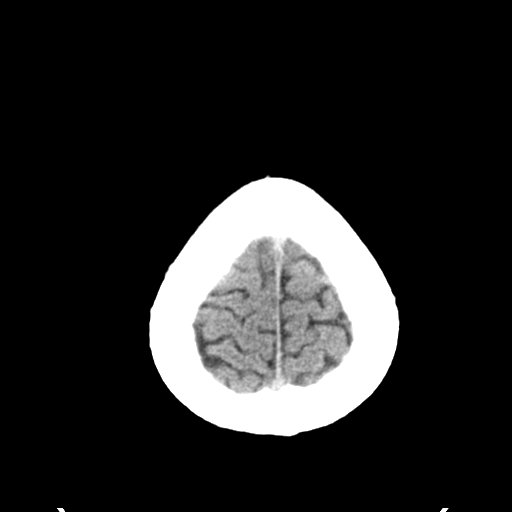
[im 29/32  brain]
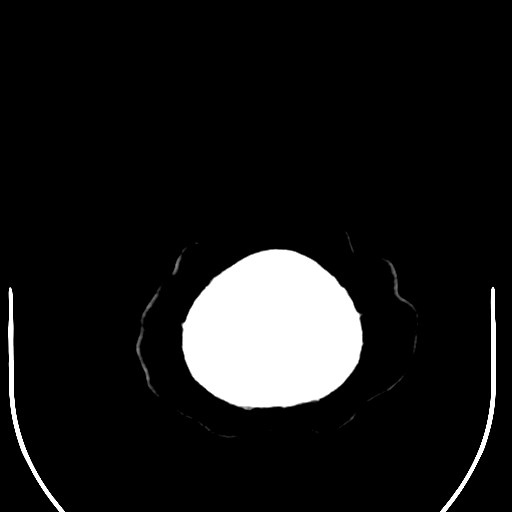
[im 29/32  bone]
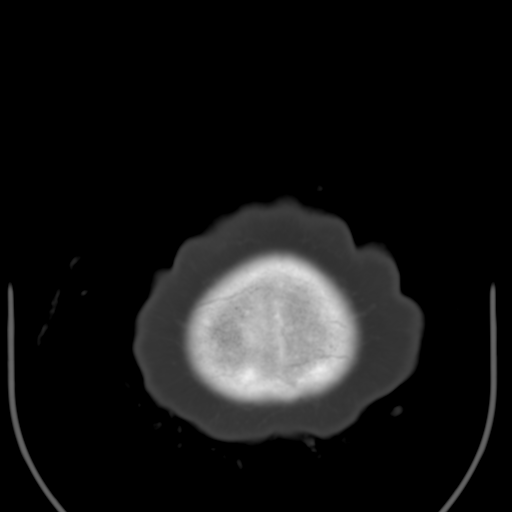

[Series 4: coronal soft tissue · coronal · 0.30mm/px · 3 of 67 slices shown]
[im 23/67  brain]
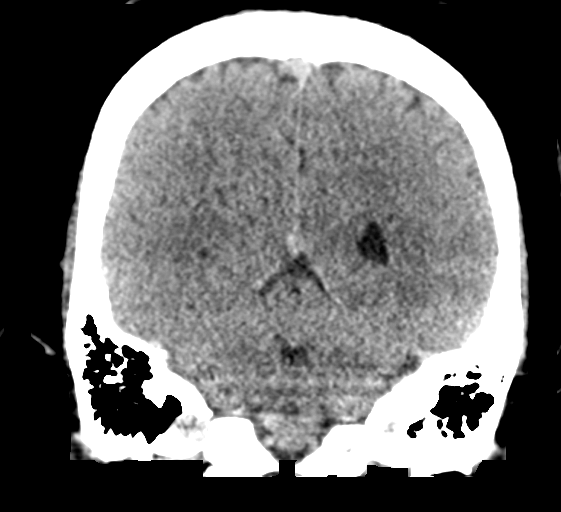
[im 30/67  brain]
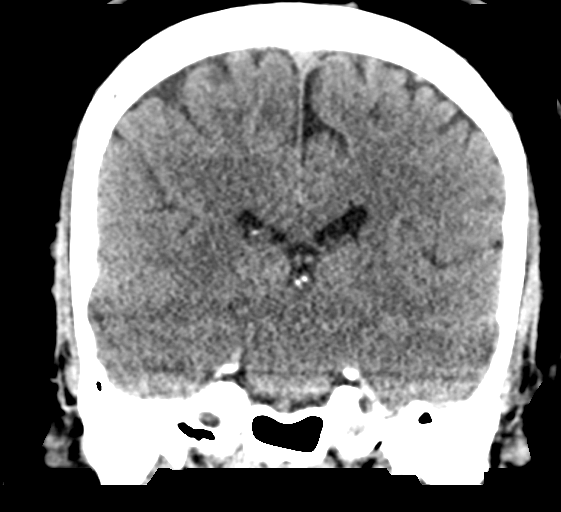
[im 37/67  brain]
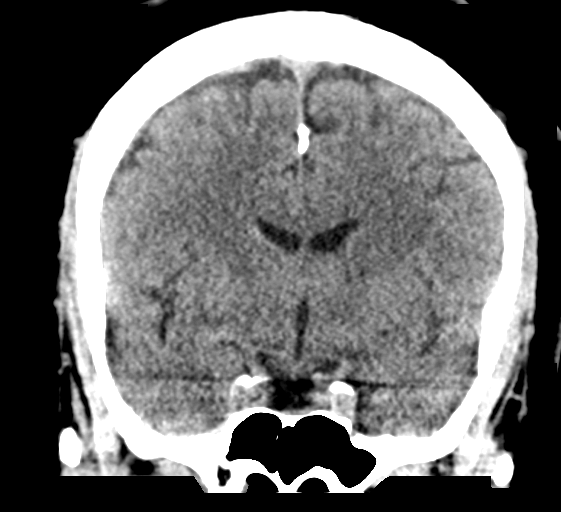

[Series 5: sagittal soft tissue · sagittal · 0.32mm/px · 3 of 57 slices shown]
[im 19/57  brain]
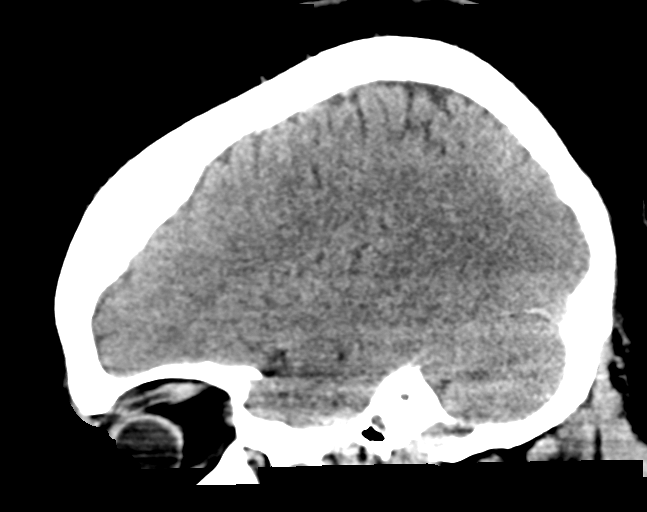
[im 29/57  brain]
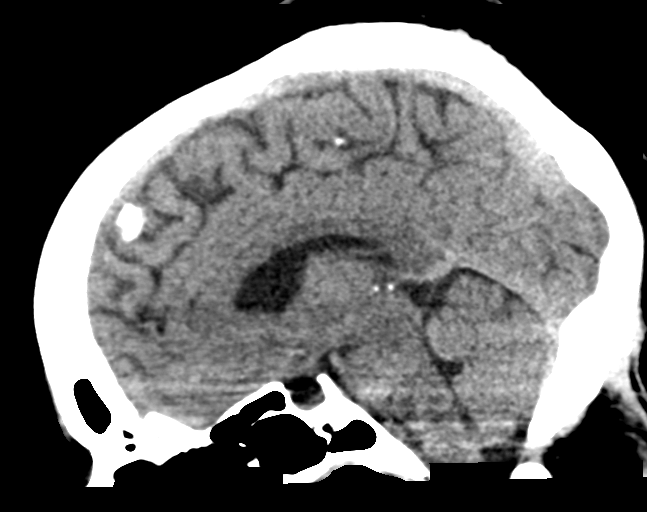
[im 38/57  brain]
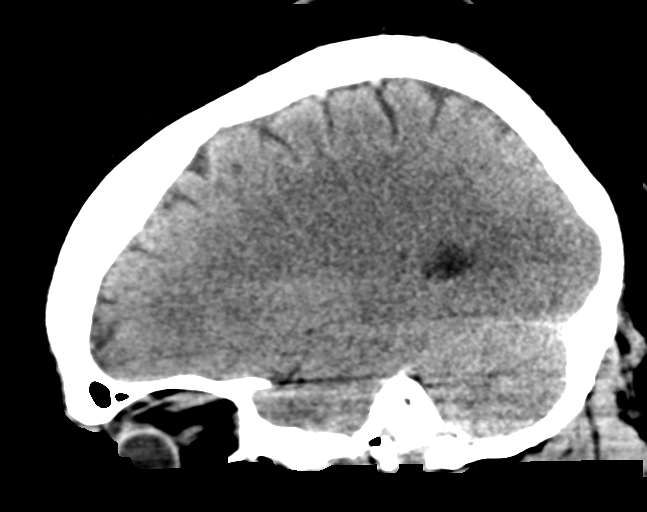

[15 of 47 positions shown; findings below may reference images not displayed]

FINDINGS: Brain: No subdural, epidural, or subarachnoid hemorrhage. Ventricles
and sulci are normal. Cerebellum, brainstem, and basal cisterns are
normal. No mass effect or midline shift. No acute cortical ischemia
or infarct.

Vascular: No hyperdense vessel or unexpected calcification.

Skull: Normal. Negative for fracture or focal lesion.

Sinuses/Orbits: There is opacification of right-sided ethmoid air
cells. The right maxillary sinus is not well evaluated. However,
there may be some mild opacification superiorly and there is some
associated wall thickening suggesting underlying chronic sinus
disease. The visualized paranasal sinuses are otherwise normal.
Mastoid air cells and middle ears are otherwise normal.

Other: None.
IMPRESSION: 1. Only the superior most aspect of the right maxillary sinus is
seen on this study. There may be opacification in the superior
right-sided maxillary sinus with associated wall thickening
suggesting chronic underlying right maxillary sinus disease. There
is opacification of right-sided ethmoid air cells as well.
2. No acute intracranial abnormality noted.

## 2020-02-17 ENCOUNTER — Other Ambulatory Visit: Payer: Self-pay

## 2020-02-17 ENCOUNTER — Inpatient Hospital Stay (HOSPITAL_COMMUNITY)
Admission: EM | Admit: 2020-02-17 | Discharge: 2020-02-18 | DRG: 638 | Disposition: A | Discharge status: Home / Self Care | Payer: Medicaid Other | Attending: Internal Medicine | Admitting: Internal Medicine

## 2020-02-17 ENCOUNTER — Encounter (HOSPITAL_COMMUNITY): Payer: Self-pay

## 2020-02-17 DIAGNOSIS — Z7289 Other problems related to lifestyle: Secondary | ICD-10-CM | POA: Diagnosis not present

## 2020-02-17 DIAGNOSIS — F17210 Nicotine dependence, cigarettes, uncomplicated: Secondary | ICD-10-CM | POA: Diagnosis present

## 2020-02-17 DIAGNOSIS — E1110 Type 2 diabetes mellitus with ketoacidosis without coma: Secondary | ICD-10-CM | POA: Diagnosis present

## 2020-02-17 DIAGNOSIS — E860 Dehydration: Secondary | ICD-10-CM | POA: Diagnosis present

## 2020-02-17 DIAGNOSIS — E119 Type 2 diabetes mellitus without complications: Secondary | ICD-10-CM

## 2020-02-17 DIAGNOSIS — Z716 Tobacco abuse counseling: Secondary | ICD-10-CM

## 2020-02-17 DIAGNOSIS — Z720 Tobacco use: Secondary | ICD-10-CM

## 2020-02-17 DIAGNOSIS — Z20822 Contact with and (suspected) exposure to covid-19: Secondary | ICD-10-CM | POA: Diagnosis present

## 2020-02-17 DIAGNOSIS — E871 Hypo-osmolality and hyponatremia: Secondary | ICD-10-CM | POA: Diagnosis present

## 2020-02-17 HISTORY — DX: Other specified health status: Z78.9

## 2020-02-17 LAB — CBC WITH DIFFERENTIAL/PLATELET
Abs Immature Granulocytes: 0.02 10*3/uL (ref 0.00–0.07)
Basophils Absolute: 0 10*3/uL (ref 0.0–0.1)
Basophils Relative: 1 %
Eosinophils Absolute: 0.3 10*3/uL (ref 0.0–0.5)
Eosinophils Relative: 4 %
HCT: 43.7 % (ref 39.0–52.0)
Hemoglobin: 15.1 g/dL (ref 13.0–17.0)
Immature Granulocytes: 0 %
Lymphocytes Relative: 39 %
Lymphs Abs: 2.5 10*3/uL (ref 0.7–4.0)
MCH: 29.2 pg (ref 26.0–34.0)
MCHC: 34.6 g/dL (ref 30.0–36.0)
MCV: 84.4 fL (ref 80.0–100.0)
Monocytes Absolute: 0.4 10*3/uL (ref 0.1–1.0)
Monocytes Relative: 7 %
Neutro Abs: 3.1 10*3/uL (ref 1.7–7.7)
Neutrophils Relative %: 49 %
Platelets: 281 10*3/uL (ref 150–400)
RBC: 5.18 MIL/uL (ref 4.22–5.81)
RDW: 12.3 % (ref 11.5–15.5)
WBC: 6.3 10*3/uL (ref 4.0–10.5)
nRBC: 0 % (ref 0.0–0.2)

## 2020-02-17 LAB — URINALYSIS, ROUTINE W REFLEX MICROSCOPIC
Bacteria, UA: NONE SEEN
Bilirubin Urine: NEGATIVE
Glucose, UA: >=500 mg/dL — AB
Hgb urine dipstick: NEGATIVE
Ketones, ur: 20 mg/dL — AB
Leukocytes,Ua: NEGATIVE
Nitrite: NEGATIVE
Protein, ur: NEGATIVE mg/dL
Specific Gravity, Urine: 1.032 — ABNORMAL HIGH (ref 1.005–1.030)
pH: 5 (ref 5.0–8.0)

## 2020-02-17 LAB — CBG MONITORING, ED
Glucose-Capillary: 402 mg/dL — ABNORMAL HIGH (ref 70–99)
Glucose-Capillary: 235 mg/dL — ABNORMAL HIGH (ref 70–99)
Glucose-Capillary: 268 mg/dL — ABNORMAL HIGH (ref 70–99)
Glucose-Capillary: 143 mg/dL — ABNORMAL HIGH (ref 70–99)
Glucose-Capillary: 121 mg/dL — ABNORMAL HIGH (ref 70–99)
Glucose-Capillary: 125 mg/dL — ABNORMAL HIGH (ref 70–99)
Glucose-Capillary: 124 mg/dL — ABNORMAL HIGH (ref 70–99)
Glucose-Capillary: 111 mg/dL — ABNORMAL HIGH (ref 70–99)
Glucose-Capillary: 121 mg/dL — ABNORMAL HIGH (ref 70–99)
Glucose-Capillary: 117 mg/dL — ABNORMAL HIGH (ref 70–99)

## 2020-02-17 LAB — BASIC METABOLIC PANEL
Anion gap: 9 (ref 5–15)
BUN: 14 mg/dL (ref 6–20)
CO2: 25 mmol/L (ref 22–32)
Calcium: 8.7 mg/dL — ABNORMAL LOW (ref 8.9–10.3)
Chloride: 102 mmol/L (ref 98–111)
Creatinine, Ser: 0.9 mg/dL (ref 0.61–1.24)
GFR calc Af Amer: >60 mL/min (ref >60)
GFR calc non Af Amer: >60 mL/min (ref >60)
Glucose, Bld: 125 mg/dL — ABNORMAL HIGH (ref 70–99)
Potassium: 3.8 mmol/L (ref 3.5–5.1)
Sodium: 136 mmol/L (ref 135–145)

## 2020-02-17 LAB — COMPREHENSIVE METABOLIC PANEL
ALT: 24 U/L (ref 0–44)
AST: 20 U/L (ref 15–41)
Albumin: 4 g/dL (ref 3.5–5.0)
Alkaline Phosphatase: 87 U/L (ref 38–126)
Anion gap: 18 — ABNORMAL HIGH (ref 5–15)
BUN: 18 mg/dL (ref 6–20)
CO2: 18 mmol/L — ABNORMAL LOW (ref 22–32)
Calcium: 9.1 mg/dL (ref 8.9–10.3)
Chloride: 95 mmol/L — ABNORMAL LOW (ref 98–111)
Creatinine, Ser: 1.11 mg/dL (ref 0.61–1.24)
GFR calc Af Amer: >60 mL/min (ref >60)
GFR calc non Af Amer: >60 mL/min (ref >60)
Glucose, Bld: 428 mg/dL — ABNORMAL HIGH (ref 70–99)
Potassium: 4.6 mmol/L (ref 3.5–5.1)
Sodium: 131 mmol/L — ABNORMAL LOW (ref 135–145)
Total Bilirubin: 1.5 mg/dL — ABNORMAL HIGH (ref 0.3–1.2)
Total Protein: 7.2 g/dL (ref 6.5–8.1)

## 2020-02-17 LAB — BETA-HYDROXYBUTYRIC ACID: Beta-Hydroxybutyric Acid: 5.37 mmol/L — ABNORMAL HIGH (ref 0.05–0.27)

## 2020-02-17 LAB — HEMOGLOBIN A1C
Hgb A1c MFr Bld: 13.7 % — ABNORMAL HIGH (ref 4.8–5.6)
Mean Plasma Glucose: 346.49 mg/dL

## 2020-02-17 LAB — GLUCOSE, CAPILLARY: Glucose-Capillary: 315 mg/dL — ABNORMAL HIGH (ref 70–99)

## 2020-02-17 LAB — SARS CORONAVIRUS 2 BY RT PCR (HOSPITAL ORDER, PERFORMED IN ~~LOC~~ HOSPITAL LAB): SARS Coronavirus 2: NEGATIVE

## 2020-02-17 MED ORDER — INSULIN GLARGINE 100 UNIT/ML ~~LOC~~ SOLN
12.0000 [IU] | Freq: Every day | SUBCUTANEOUS | Status: DC
  Administered 2020-02-17: 12 [IU] via SUBCUTANEOUS
  Filled 2020-02-17 (×3): qty 0.12

## 2020-02-17 MED ORDER — LIVING WELL WITH DIABETES BOOK
Freq: Once | Status: AC
  Filled 2020-02-17 (×2): qty 1

## 2020-02-17 MED ORDER — POTASSIUM CHLORIDE IN NACL 20-0.9 MEQ/L-% IV SOLN
INTRAVENOUS | Status: DC
  Filled 2020-02-17: qty 1000

## 2020-02-17 MED ORDER — INSULIN ASPART 100 UNIT/ML ~~LOC~~ SOLN
0.0000 [IU] | Freq: Three times a day (TID) | SUBCUTANEOUS | Status: DC
  Administered 2020-02-18: 3 [IU] via SUBCUTANEOUS
  Administered 2020-02-18: 8 [IU] via SUBCUTANEOUS

## 2020-02-17 MED ORDER — SODIUM CHLORIDE 0.9 % IV SOLN: INTRAVENOUS | Status: DC

## 2020-02-17 MED ORDER — INSULIN ASPART 100 UNIT/ML IV SOLN
10.0000 [IU] | Freq: Once | INTRAVENOUS | Status: AC
  Administered 2020-02-17: 10 [IU] via INTRAVENOUS

## 2020-02-17 MED ORDER — SODIUM CHLORIDE 0.9 % IV BOLUS
1000.0000 mL | Freq: Once | INTRAVENOUS | Status: AC
  Administered 2020-02-17: 1000 mL via INTRAVENOUS

## 2020-02-17 MED ORDER — NICOTINE 21 MG/24HR TD PT24
21.0000 mg | MEDICATED_PATCH | Freq: Every day | TRANSDERMAL | Status: DC
  Administered 2020-02-17: 21 mg via TRANSDERMAL
  Filled 2020-02-17: qty 1

## 2020-02-17 MED ORDER — INSULIN REGULAR(HUMAN) IN NACL 100-0.9 UT/100ML-% IV SOLN
INTRAVENOUS | Status: DC
  Administered 2020-02-17: 15 [IU]/h via INTRAVENOUS
  Filled 2020-02-17: qty 100

## 2020-02-17 MED ORDER — INSULIN ASPART 100 UNIT/ML ~~LOC~~ SOLN
0.0000 [IU] | Freq: Every day | SUBCUTANEOUS | Status: DC
  Administered 2020-02-17: 4 [IU] via SUBCUTANEOUS

## 2020-02-17 MED ORDER — POTASSIUM CHLORIDE 10 MEQ/100ML IV SOLN
10.0000 meq | INTRAVENOUS | Status: AC
  Administered 2020-02-17 (×2): 10 meq via INTRAVENOUS
  Filled 2020-02-17 (×2): qty 100

## 2020-02-17 MED ORDER — DEXTROSE 50 % IV SOLN: 0.0000 mL | INTRAVENOUS | Status: DC | PRN

## 2020-02-17 MED ORDER — DEXTROSE-NACL 5-0.45 % IV SOLN: INTRAVENOUS | Status: DC

## 2020-02-17 MED ORDER — ENOXAPARIN SODIUM 40 MG/0.4ML ~~LOC~~ SOLN
40.0000 mg | SUBCUTANEOUS | Status: DC
  Administered 2020-02-17: 40 mg via SUBCUTANEOUS
  Filled 2020-02-17: qty 0.4

## 2020-02-17 NOTE — Progress Notes (Addendum)
Inpatient Diabetes Program Recommendations  AACE/ADA: New Consensus Statement on Inpatient Glycemic Control   Target Ranges:  Prepandial:   less than 140 mg/dL      Peak postprandial:   less than 180 mg/dL (1-2 hours)      Critically ill patients:  140 - 180 mg/dL   Results for EFSTATHIOS, SAWIN (MRN [id number]) as of 02/17/2020 10:38  Ref. Range 02/17/2020 08:16  Glucose Latest Ref Range: 70 - 99 mg/dL 734 (H)  Results for ALEJOS, REINHARDT (MRN [id number]) as of 02/17/2020 10:38  Ref. Range 02/17/2020 08:16  CO2 Latest Ref Range: 22 - 32 mmol/L 18 (L)  Results for DONOVON, MICHELETTI (MRN [id number]) as of 02/17/2020 10:38  Ref. Range 02/17/2020 08:16  Anion gap Latest Ref Range: 5 - 15  18 (H)  Results for RAMESSES, CRAMPTON (MRN [id number]) as of 02/17/2020 14:10  Ref. Range 02/17/2020 08:16  Beta-Hydroxybutyric Acid Latest Ref Range: 0.05 - 0.27 mmol/L 5.37 (H)   Review of Glycemic Control  Diabetes history: No Outpatient Diabetes medications: NA Current orders for Inpatient glycemic control: IV insulin  Inpatient Diabetes Program Recommendations:    IV insulin: IV insulin should be continued until acidosis is cleared (CO2 >20, AG <10-12, and beta-hydroxybutyric acid < 0.5)  as determined by MD.  HbgA1C:  A1C ordered. In reviewing chart noted A1C 6.0% on 03/31/2017 in Care Everywhere. Patient seen Dr. Cyndia Bent on 03/31/2017 and per office note patient has a family hx of DM and was dx with pre-diabetes at that office visit.   NOTE: Initial glucose 428 mg/dl and patient is ordered IV insulin for DKA. Per H&P, patient does not have a hx of DM, has been having symptoms of hyperglycemia for 2 weeks, and has recently lost 30 pounds. Current A1C in process. Noted in Care Everywhere that A1C was 6.0% on 03/31/2017 and patient seen PCP Dr. Cyndia Bent on 03/31/2017 and per office visit note patient was dx with pre-diabetes at that time. Ordered Living Well with DM book, TOC consult for medication assistance if needed, and  patient education by bedside RNs. Will plan to speak with patient today.  Addendum 02/17/20@14 :25-Spoke with patient over the phone regarding new DM dx. Patient states that he has never been told he had DM or pre-DM. Patient has not seen Dr. Cyndia Bent (PCP) in over 1 year and does not currently have any insurance. Patient reports that he is currently unemployed and would be interested in getting established with a local clinic for follow up. Patient states that he has several family members with DM (mother and brothers) and states that his mother and one of his brothers have passed away from complications from uncontrolled DM. Patient states that over the past 2-4 weeks, he has been experiencing symptoms of hyperglycemia and over the past month he has lost 30 pounds. Discussed initial glucose of 428 mg/dl on 10/19/27 and discussed pathophysiology of DKA in basic terms. Discussed DKA protocol and explained that IV insulin would be continued until acidosis is cleared and then he would be transitioned to SQ insulin. Discussed what an A1C is and explained that his current A1C has not resulted yet. Informed patient that he should be receiving a Living Well with DM book, RNs will be working with him on DM education, RD would talk with him while inpatient regarding Carb Modified diet, and that TOC would talk with him as well regarding follow up and medication assistance. Patient states that he was concerned about affording medications and  following up due to not having insurance. Informed patient that TOC would be working with him to assist with arranging follow up and with medications. Encouraged patient to start reading the Living Well with DM book once received. Informed patient that diabetes coordinator will follow up with him tomorrow regarding new DM dx, A1C, and potential discharge plan for DM management as an outpatient. Patient stated that he would like to have something to eat or drink. Informed patient that I would  ask RN if he could have non caloric liquids such as diet ginger ale. Patient verbalized understanding of information discussed and states that he has no questions at this time related to DM.    Thanks, Barnie Alderman, RN, MSN, CDE Diabetes Coordinator Inpatient Diabetes Program [phone] (Team Pager from 8am to 5pm)

## 2020-02-17 NOTE — TOC Initial Note (Signed)
Transition of Care [facility]) - Initial/Assessment Note   Patient Details  Name: Danny Montes MRN: [id number] Date of Birth: Dec 18, 1970  Transition of Care [facility]) CM/SW Contact:    Danny Don, LCSW Phone Number: 02/17/2020, 4:29 PM  Clinical Narrative: Patient is a 49 year old male who was admitted for DKA. TOC received consult for insurance and medication assistance needs. CSW met with patient in the ED to discuss patient's needs and referrals. Patient reported he no longer sees his PCP, Danny Montes, as he lost his job and insurance last year during the early months of [place]. CSW discussed [facility] for a PCP and [facility] for a Medicaid referral. Patient agreeable to both referrals. CSW called [facility] and left voicemail with referral information. CSW called [facility] and made referral for assistance with getting the patient a PCP.              Expected Discharge Plan: Home/Self Care Barriers to Discharge: Continued Medical Work up  Patient Goals and CMS Choice Choice offered to / list presented to : NA  Expected Discharge Plan and Services Expected Discharge Plan: Home/Self Care In-house Referral: Clinical Social Work Discharge Planning Services: [place], Other - See comment(Referrals for [facility] and [facility]) Post Acute Care Choice: NA Living arrangements for the past 2 months: Single Family Home            DME Arranged: N/A DME Agency: NA HH Arranged: NA HH Agency: NA  Prior Living Arrangements/Services Living arrangements for the past 2 months: Single Family Home Lives with:: Minor Children, Spouse Patient language and need for interpreter reviewed:: Yes Do you feel safe going back to the place where you live?: Yes  Need for Family Participation in Patient Care: No (Comment) Care giver support system in place?: Yes (comment)(Danny Montes (wife) PH: [phone]) Criminal Activity/Legal Involvement Pertinent to Current  Situation/Hospitalization: No - Comment as needed  Permission Sought/Granted Permission sought to share information with : Other (comment)([facility]; [facility]) Permission granted to share info w AGENCY: [facility]; [facility] Permission granted to share info w Contact Information: [phone]; [phone]  Emotional Assessment Appearance:: Appears stated age Attitude/Demeanor/Rapport: Engaged Affect (typically observed): Accepting Orientation: : Oriented to Self, Oriented to Place, Oriented to  Time, Oriented to Situation Alcohol / Substance Use: Tobacco Use Psych Involvement: No (comment)  Admission diagnosis:  DKA, type 2 ([place]) [E11.10] Patient Active Problem List   Diagnosis Date Noted  . DKA, type 2 ([place]) 02/17/2020  . Tobacco abuse 02/17/2020   PCP:  Danny Noon, MD Pharmacy:   [place] [facility] [place], [address] [place] [address] Phone: [phone] Fax: [phone]  [facility] [place], [place] [address] AT [facility] [address] [place] [address] Phone: [phone] Fax: [place], [place] [address] AT [place] [address] [place] [address] Phone: [phone] Fax: [place], [place] [address] AT [place] [place] [address] Phone: [phone] Fax: [phone]  Readmission Risk Interventions No flowsheet data found.

## 2020-02-17 NOTE — H&P (Signed)
History and Physical  Danny Montes KGU:[id number] DOB: 1971/02/28 DOA: 02/17/2020   PCP: Chesley Noon, MD   Patient coming from: Home  Chief Complaint: dizziness  HPI:  Danny Montes is a 49 y.o. male with no documented chronic medical problems presenting with 2-week history of dizziness, polydipsia, polyuria, and nausea.  He denies any vomiting, fevers, chills, chest pain, shortness of breath, coughing, hemoptysis abdominal pain, diarrhea, hematochezia, melena.  He endorses unintended 30 pound weight loss.  He denies any new medications.  He denies any illicit drug use.  He has not had any syncope, focal extremity weakness, dysarthria, or visual disturbance. In the emergency department, the patient was afebrile hemodynamically stable with oxygen saturation 100% room air.  BMP showed a sodium 131, serum glucose 428, anion gap 18.  WBC 6.3, hemoglobin 15.1, platelets 281,000.  UA was negative for pyuria but showed ketonuria.  The patient was started on intravenous fluids and IV insulin.  Assessment/Plan: DKA type 2 -patient started on IV insulin with q 1 hour CBG check and q 4 hour BMPs -pt started on aggressive fluid resuscitation -Electrolytes were monitored and repleted -transitioned to [place] insulin once anion gap closed -diet was advanced once anion gap closed -HbA1C--  Hyponatremia -Secondary to hyperglycemia and dehydration -Continue IV fluids  Gap metabolic acidosis -Secondary to DKA -Continue IV fluids  Tobacco abuse -cessation discussed -nicoderm patch       History reviewed. No pertinent past medical history. History reviewed. No pertinent surgical history. Social History:  reports that he has been smoking cigarettes. He has been smoking about 0.00 packs per day for the past 0.00 years. He has never used smokeless tobacco. He reports current alcohol use. He reports that he does not use drugs.   Family History: reviewed.  No pertinent family  history  No Known Allergies   Prior to Admission medications   Medication Sig Start Date End Date Taking? Authorizing Provider  amoxicillin-clavulanate (AUGMENTIN) 875-125 MG tablet Take 1 tablet by mouth every 12 (twelve) hours. 03/09/18   Idol, Almyra Free, PA-C  guaiFENesin (MUCINEX) 600 MG 12 hr tablet Take 600 mg by mouth 2 (two) times daily as needed for cough or to loosen phlegm.    [provider]  meclizine (ANTIVERT) 25 MG tablet Take 1 tablet (25 mg total) by mouth every 6 (six) hours as needed for dizziness. 03/09/18   Evalee Jefferson, PA-C  pseudoephedrine-acetaminophen (TYLENOL SINUS) 30-500 MG TABS tablet Take 1 tablet by mouth every 4 (four) hours as needed.    [provider]    Review of Systems:  Constitutional:  No weight loss, night sweats, Fevers, chills, fatigue.  Head&Eyes: No headache.  No vision loss.  No eye pain or scotoma ENT:  No Difficulty swallowing,Tooth/dental problems,Sore throat,  No ear ache, post nasal drip,  Cardio-vascular:  No chest pain, Orthopnea, PND, swelling in lower extremities,  dizziness, palpitations  GI:  No  abdominal pain, nausea, vomiting, diarrhea, loss of appetite, hematochezia, melena, heartburn, indigestion, Resp:  No shortness of breath with exertion or at rest. No cough. No coughing up of blood .No wheezing.No chest wall deformity  Skin:  no rash or lesions.  GU:  no dysuria,  no urgency or frequency. No flank pain.  Musculoskeletal:  No joint pain or swelling. No decreased range of motion. No back pain.  Psych:  No change in mood or affect. No depression or anxiety. Neurologic: No headache, no dysesthesia, no focal weakness,  no vision loss. No syncope  Physical Exam: Vitals:   02/17/20 0740 02/17/20 0742  BP:  (!) 130/101  Pulse:  92  Resp:  18  Temp:  98.1 F (36.7 C)  TempSrc:  Oral  SpO2:  100%  Weight: 104.3 kg   Height: 5\' 5"  (1.651 m)    General:  A&O x 3, NAD, nontoxic,  pleasant/cooperative Head/Eye: No conjunctival hemorrhage, no icterus, [place]/AT, No nystagmus ENT:  No icterus,  No thrush, good dentition, no pharyngeal exudate Neck:  No masses, no lymphadenpathy, no bruits CV:  RRR, no rub, no gallop, no S3 Lung:  CTAB, good air movement, no wheeze, no rhonchi Abdomen: soft/NT, +BS, nondistended, no peritoneal signs Ext: No cyanosis, No rashes, No petechiae, No lymphangitis, No edema Neuro: CNII-XII intact, strength 4/5 in bilateral upper and lower extremities, no dysmetria  Labs on Admission:  Basic Metabolic Panel: Recent Labs  Lab 02/17/20 0816  NA 131*  K 4.6  CL 95*  CO2 18*  GLUCOSE 428*  BUN 18  CREATININE 1.11  CALCIUM 9.1   Liver Function Tests: Recent Labs  Lab 02/17/20 0816  AST 20  ALT 24  ALKPHOS 87  BILITOT 1.5*  PROT 7.2  ALBUMIN 4.0   No results for input(s): LIPASE, AMYLASE in the last 168 hours. No results for input(s): AMMONIA in the last 168 hours. CBC: Recent Labs  Lab 02/17/20 0816  WBC 6.3  NEUTROABS 3.1  HGB 15.1  HCT 43.7  MCV 84.4  PLT 281   Coagulation Profile: No results for input(s): INR, PROTIME in the last 168 hours. Cardiac Enzymes: No results for input(s): CKTOTAL, CKMB, CKMBINDEX, TROPONINI in the last 168 hours. BNP: Invalid input(s): POCBNP CBG: Recent Labs  Lab 02/17/20 0745  GLUCAP 402*   Urine analysis:    Component Value Date/Time   COLORURINE YELLOW 02/17/2020 0835   APPEARANCEUR CLEAR 02/17/2020 0835   LABSPEC 1.032 (H) 02/17/2020 0835   PHURINE 5.0 02/17/2020 0835   GLUCOSEU >=500 (A) 02/17/2020 0835   HGBUR NEGATIVE 02/17/2020 0835   BILIRUBINUR NEGATIVE 02/17/2020 0835   KETONESUR 20 (A) 02/17/2020 0835   PROTEINUR NEGATIVE 02/17/2020 0835   NITRITE NEGATIVE 02/17/2020 0835   LEUKOCYTESUR NEGATIVE 02/17/2020 0835   Sepsis Labs: @LABRCNTIP (procalcitonin:4,lacticidven:4) )No results found for this or any previous visit (from the past 240 hour(s)).   Radiological  Exams on Admission: No results found.  EKG: Independently reviewed. Sinus, nonspecific TWI    Time spent:60 minutes Code Status:   FULL Family Communication:  Spouse updated at bedside 6/3 Disposition Plan: expect 1-2 day hospitalization Consults called: none DVT Prophylaxis: [place] Lovenox  , DO  [facility] Pager [phone]  If 7PM-7AM, please contact night-coverage [email or web address] Password [facility] 02/17/2020, 9:46 AM

## 2020-02-17 NOTE — ED Provider Notes (Signed)
[facility] Emergency Department Provider Note MRN:  [id number]  Arrival date & time: 02/17/20     Chief Complaint   Dizziness   History of Present Illness   Danny Montes is a 49 y.o. year-old male with no pertinent past medical presenting to the ED with chief complaint of dizziness.  Dizziness described as a lightheadedness for the past 2 weeks.  Also endorsing 30 pound weight loss in the past month.  Excessive thirst, frequent urination, mild nausea, no vomiting.  Denies chest pain or shortness of breath, no abdominal pain.  Patient has a family history of diabetes and he is concerned that he might have a.  Denies fever, no cough, no dysuria.  Review of Systems  A complete 10 system review of systems was obtained and all systems are negative except as noted in the HPI and PMH.   Patient's Health History   History reviewed. No pertinent past medical history.  History reviewed. No pertinent surgical history.  No family history on file.  Social History   Socioeconomic History  . Marital status: Married    Spouse name: Not on file  . Number of children: Not on file  . Years of education: Not on file  . Highest education level: Not on file  Occupational History  . Not on file  Tobacco Use  . Smoking status: Current Every Day Smoker    Packs/day: 0.00    Years: 0.00    Pack years: 0.00    Types: Cigarettes  . Smokeless tobacco: Never Used  Substance and Sexual Activity  . Alcohol use: Yes    Comment: OCCASIONAL  . Drug use: No  . Sexual activity: Not on file  Other Topics Concern  . Not on file  Social History Narrative  . Not on file   Social Determinants of Health   Financial Resource Strain:   . Difficulty of Paying Living Expenses:   Food Insecurity:   . Worried About Charity fundraiser in the Last Year:   . Arboriculturist in the Last Year:   Transportation Needs:   . Film/video editor (Medical):   Marland Kitchen Lack of Transportation  (Non-Medical):   Physical Activity:   . Days of Exercise per Week:   . Minutes of Exercise per Session:   Stress:   . Feeling of Stress :   Social Connections:   . Frequency of Communication with Friends and Family:   . Frequency of Social Gatherings with Friends and Family:   . Attends Religious Services:   . Active Member of Clubs or Organizations:   . Attends Archivist Meetings:   Marland Kitchen Marital Status:   Intimate Partner Violence:   . Fear of Current or Ex-Partner:   . Emotionally Abused:   Marland Kitchen Physically Abused:   . Sexually Abused:      Physical Exam   Vitals:   02/17/20 0742  BP: (!) 130/101  Pulse: 92  Resp: 18  Temp: 98.1 F (36.7 C)  SpO2: 100%    CONSTITUTIONAL: Well-appearing, NAD NEURO:  Alert and oriented x 3, no focal deficits EYES:  eyes equal and reactive ENT/NECK:  no LAD, no JVD CARDIO: Regular rate, well-perfused, normal S1 and S2 PULM:  CTAB no wheezing or rhonchi GI/GU:  normal bowel sounds, non-distended, non-tender MSK/SPINE:  No gross deformities, no edema SKIN:  no rash, atraumatic PSYCH:  Appropriate speech and behavior  *Additional and/or pertinent findings included in MDM below  Diagnostic and Interventional Summary    EKG Interpretation  Date/Time:  Thursday February 17 2020 07:43:31 EDT Ventricular Rate:  88 PR Interval:    QRS Duration: 83 QT Interval:  352 QTC Calculation: 426 R Axis:   -51 Text Interpretation: Sinus rhythm Left anterior fascicular block Abnormal R-wave progression, late transition ST elev, probable normal early repol pattern Confirmed by Kennis Carina [phone]) on 02/17/2020 8:53:14 AM      Labs Reviewed  COMPREHENSIVE METABOLIC PANEL - Abnormal; Notable for the following components:      Result Value   Sodium 131 (*)    Chloride 95 (*)    CO2 18 (*)    Glucose, Bld 428 (*)    Total Bilirubin 1.5 (*)    Anion gap 18 (*)    All other components within normal limits  URINALYSIS, ROUTINE W REFLEX  MICROSCOPIC - Abnormal; Notable for the following components:   Specific Gravity, Urine 1.032 (*)    Glucose, UA >=500 (*)    Ketones, ur 20 (*)    All other components within normal limits  CBG MONITORING, ED - Abnormal; Notable for the following components:   Glucose-Capillary 402 (*)    All other components within normal limits  SARS CORONAVIRUS 2 BY RT PCR (HOSPITAL ORDER, PERFORMED IN [facility])  CBC WITH DIFFERENTIAL/PLATELET    No orders to display    Medications  sodium chloride 0.9 % bolus 1,000 mL (has no administration in time range)  insulin aspart (novoLOG) injection 10 Units (has no administration in time range)     Procedures  /  Critical Care .Critical Care Performed by: Sabas Sous, MD Authorized by: Sabas Sous, MD   Critical care provider statement:    Critical care time (minutes):  31   Critical care was necessary to treat or prevent imminent or life-threatening deterioration of the following conditions:  Metabolic crisis (Diabetic ketoacidosis)   Critical care was time spent personally by me on the following activities:  Discussions with consultants, evaluation of patient's response to treatment, examination of patient, ordering and performing treatments and interventions, ordering and review of laboratory studies, ordering and review of radiographic studies, pulse oximetry, re-evaluation of patient's condition, obtaining history from patient or surrogate and review of old charts    ED Course and Medical Decision Making  I have reviewed the triage vital signs, the nursing notes, and pertinent available records from the EMR.  Listed above are laboratory and imaging tests that I personally ordered, reviewed, and interpreted and then considered in my medical decision making (see below for details).      History very consistent with dehydration in the setting of new onset diabetes, confirmed by POC glucose.  Labs reveal decreased bicarb,  elevated anion gap, ketones in the urine, consistent with mild DKA.  Given this finding and the new diagnosis, will request admission for diabetic education and management of dehydration, elevated anion gap.    Elmer Sow. Pilar Plate, MD [facility] [email or web address]  Final Clinical Impressions(s) / ED Diagnoses     ICD-10-CM   1. New onset type 2 diabetes mellitus (HCC)  E11.9   2. Diabetic ketoacidosis without coma associated with type 2 diabetes mellitus (HCC)  E11.10     ED Discharge Orders    None       Discharge Instructions Discussed with and Provided to Patient:   Discharge Instructions   None  Sabas Sous, MD 02/17/20 [phone]

## 2020-02-17 NOTE — ED Triage Notes (Signed)
Pt reports has lost 30lb in the past month.  Reports excessive thirst and nausea, no vomiting.  Reports dizziness x 2 weeks.  Denies any pain.

## 2020-02-18 DIAGNOSIS — E119 Type 2 diabetes mellitus without complications: Secondary | ICD-10-CM

## 2020-02-18 LAB — BASIC METABOLIC PANEL
Anion gap: 9 (ref 5–15)
BUN: 14 mg/dL (ref 6–20)
CO2: 24 mmol/L (ref 22–32)
Calcium: 8.6 mg/dL — ABNORMAL LOW (ref 8.9–10.3)
Chloride: 102 mmol/L (ref 98–111)
Creatinine, Ser: 0.92 mg/dL (ref 0.61–1.24)
GFR calc Af Amer: >60 mL/min (ref >60)
GFR calc non Af Amer: >60 mL/min (ref >60)
Glucose, Bld: 269 mg/dL — ABNORMAL HIGH (ref 70–99)
Potassium: 4.4 mmol/L (ref 3.5–5.1)
Sodium: 135 mmol/L (ref 135–145)

## 2020-02-18 LAB — GLUCOSE, CAPILLARY
Glucose-Capillary: 184 mg/dL — ABNORMAL HIGH (ref 70–99)
Glucose-Capillary: 252 mg/dL — ABNORMAL HIGH (ref 70–99)

## 2020-02-18 LAB — MAGNESIUM: Magnesium: 1.8 mg/dL (ref 1.7–2.4)

## 2020-02-18 LAB — HIV ANTIBODY (ROUTINE TESTING W REFLEX): HIV Screen 4th Generation wRfx: NONREACTIVE

## 2020-02-18 MED ORDER — INSULIN PEN NEEDLE 32G X 4 MM MISC: 1 refills | Status: DC

## 2020-02-18 MED ORDER — NOVOLIN 70/30 FLEXPEN RELION (70-30) 100 UNIT/ML ~~LOC~~ SUPN: 10.0000 [IU] | PEN_INJECTOR | Freq: Two times a day (BID) | SUBCUTANEOUS | 2 refills | Status: DC

## 2020-02-18 MED ORDER — INSULIN ASPART PROT & ASPART (70-30 MIX) 100 UNIT/ML ~~LOC~~ SUSP
10.0000 [IU] | Freq: Two times a day (BID) | SUBCUTANEOUS | Status: DC
  Administered 2020-02-18: 10 [IU] via SUBCUTANEOUS
  Filled 2020-02-18: qty 10

## 2020-02-18 MED ORDER — INSULIN ASPART PROT & ASPART (70-30 MIX) 100 UNIT/ML ~~LOC~~ SUSP: 14.0000 [IU] | Freq: Two times a day (BID) | SUBCUTANEOUS | Status: DC

## 2020-02-18 MED ORDER — INSULIN STARTER KIT- PEN NEEDLES (ENGLISH): 1.0000 | Freq: Once | 0 refills | Status: AC

## 2020-02-18 MED ORDER — INSULIN STARTER KIT- PEN NEEDLES (ENGLISH): 1.0000 | Freq: Once | 0 refills | Status: DC

## 2020-02-18 MED ORDER — INSULIN STARTER KIT- PEN NEEDLES (ENGLISH)
1.0000 | Freq: Once | Status: DC
  Filled 2020-02-18: qty 1

## 2020-02-18 NOTE — Discharge Summary (Signed)
Physician Discharge Summary  Danny Montes LHT:[id number] DOB: 1971-01-26 DOA: 02/17/2020  PCP: Chesley Noon, MD  Admit date: 02/17/2020 Discharge date: 02/18/2020  Admitted From: Home Disposition:  Home   Recommendations for Outpatient Follow-up:  1. Follow up with PCP in 1-2 weeks 2. Please obtain BMP/CBC in one week     Discharge Condition: Stable CODE STATUS: FULL Diet recommendation: Heart Healthy / Carb Modified   Brief/Interim Summary: 49 y.o. male with no documented chronic medical problems presenting with 2-week history of dizziness, polydipsia, polyuria, and nausea.  He denies any vomiting, fevers, chills, chest pain, shortness of breath, coughing, hemoptysis abdominal pain, diarrhea, hematochezia, melena.  He endorses unintended 30 pound weight loss.  He denies any new medications.  He denies any illicit drug use.  He has not had any syncope, focal extremity weakness, dysarthria, or visual disturbance. In the emergency department, the patient was afebrile hemodynamically stable with oxygen saturation 100% room air.  BMP showed a sodium 131, serum glucose 428, anion gap 18.  WBC 6.3, hemoglobin 15.1, platelets 281,000.  UA was negative for pyuria but showed ketonuria.  The patient was started on intravenous fluids and IV insulin.  His DKA resolved.  He was transitioned to subcutaneous insulin without difficulty.  His diet was advanced which he tolerated.  Due to his financial situation, he will be discharged with 70/30 pens--10 units bid.  Diabetic education was provided  Discharge Diagnoses:  DKA type 2 -patient started on IV insulin with q 1 hour CBG check and q 4 hour BMPs -pt started on aggressive fluid resuscitation -Electrolytes were monitored and repleted -transitioned to [place] insulin once anion gap closed -diet was advanced once anion gap closed -HbA1C--13.7 -d/c home with 70/30 Relion pens--10 units bid -Rx given for pen needles, lancets, glucometer, test  strips  Hyponatremia -Secondary to hyperglycemia and dehydration -Continue IV fluids  Gap metabolic acidosis -Secondary to DKA -Continued IV fluids -improved  Tobacco abuse -cessation discussed -nicoderm patch   Discharge Instructions   Allergies as of 02/18/2020   No Known Allergies     Medication List    TAKE these medications   Insulin Pen Needle 32G X 4 MM Misc Use with insulin pen to dispense insulin as directed   insulin starter kit- pen needles Misc 1 kit by Other route once for 1 dose.   NovoLIN 70/30 FlexPen Relion (70-30) 100 UNIT/ML KwikPen Generic drug: insulin isophane & regular human Inject 10 Units into the skin 2 (two) times daily with a meal.       No Known Allergies  Consultations:  none   Procedures/Studies: No results found.      Discharge Exam: Vitals:   02/18/20 0543 02/18/20 0930  BP: 101/79 122/71  Pulse: 69 83  Resp: 18 18  Temp: 97.9 F (36.6 C) 97.9 F (36.6 C)  SpO2: 99% 98%   Vitals:   02/18/20 0007 02/18/20 0117 02/18/20 0543 02/18/20 0930  BP: 121/73 103/77 101/79 122/71  Pulse: 71 68 69 83  Resp:  '18 18 18  ' Temp: 98.6 F (37 C) 97.8 F (36.6 C) 97.9 F (36.6 C) 97.9 F (36.6 C)  TempSrc:  Oral Oral Oral  SpO2: 98% 98% 99% 98%  Weight:      Height:        General: Pt is alert, awake, not in acute distress Cardiovascular: RRR, S1/S2 +, no rubs, no gallops Respiratory: CTA bilaterally, no wheezing, no rhonchi Abdominal: Soft, NT, ND, bowel sounds + Extremities:  no edema, no cyanosis   The results of significant diagnostics from this hospitalization (including imaging, microbiology, ancillary and laboratory) are listed below for reference.    Significant Diagnostic Studies: No results found.   Microbiology: Recent Results (from the past 240 hour(s))  SARS Coronavirus 2 by RT PCR (hospital order, performed in [facility] hospital lab) Nasopharyngeal Nasopharyngeal Swab     Status: None    Collection Time: 02/17/20  9:49 AM   Specimen: Nasopharyngeal Swab  Result Value Ref Range Status   SARS Coronavirus 2 NEGATIVE NEGATIVE Final    Comment: (NOTE) SARS-CoV-2 target nucleic acids are NOT DETECTED. The SARS-CoV-2 RNA is generally detectable in upper and lower respiratory specimens during the acute phase of infection. The lowest concentration of SARS-CoV-2 viral copies this assay can detect is 250 copies / mL. A negative result does not preclude SARS-CoV-2 infection and should not be used as the sole basis for treatment or other patient management decisions.  A negative result may occur with improper specimen collection / handling, submission of specimen other than nasopharyngeal swab, presence of viral mutation(s) within the areas targeted by this assay, and inadequate number of viral copies (<250 copies / mL). A negative result must be combined with clinical observations, patient history, and epidemiological information. Fact Sheet for Patients:   StrictlyIdeas.no Fact Sheet for Healthcare Providers: BankingDealers.co.za This test is not yet approved or cleared  by the [place] FDA and has been authorized for detection and/or diagnosis of SARS-CoV-2 by FDA under an Emergency Use Authorization (EUA).  This EUA will remain in effect (meaning this test can be used) for the duration of the COVID-19 declaration under Section 564(b)(1) of the Act, 21 U.S.C. section 360bbb-3(b)(1), unless the authorization is terminated or revoked sooner. Performed at [facility], [address], [place], [place] [address]      Labs: Basic Metabolic Panel: Recent Labs  Lab 02/17/20 0816 02/17/20 0816 02/17/20 1454 02/18/20 0451  NA 131*  --  136 135  K 4.6   < > 3.8 4.4  CL 95*  --  102 102  CO2 18*  --  25 24  GLUCOSE 428*  --  125* 269*  BUN 18  --  14 14  CREATININE 1.11  --  0.90 0.92  CALCIUM 9.1  --  8.7* 8.6*  MG  --    --   --  1.8   < > = values in this interval not displayed.   Liver Function Tests: Recent Labs  Lab 02/17/20 0816  AST 20  ALT 24  ALKPHOS 87  BILITOT 1.5*  PROT 7.2  ALBUMIN 4.0   No results for input(s): LIPASE, AMYLASE in the last 168 hours. No results for input(s): AMMONIA in the last 168 hours. CBC: Recent Labs  Lab 02/17/20 0816  WBC 6.3  NEUTROABS 3.1  HGB 15.1  HCT 43.7  MCV 84.4  PLT 281   Cardiac Enzymes: No results for input(s): CKTOTAL, CKMB, CKMBINDEX, TROPONINI in the last 168 hours. BNP: Invalid input(s): POCBNP CBG: Recent Labs  Lab 02/17/20 1734 02/17/20 1836 02/17/20 2155 02/18/20 0836 02/18/20 1144  GLUCAP 121* 117* 315* 184* 252*    Time coordinating discharge:  36 minutes  Signed:  Orson Eva, DO [facility] Pager: [phone] 02/18/2020, 11:49 AM

## 2020-02-18 NOTE — TOC Transition Note (Signed)
Transition of Care [facility]) - CM/SW Discharge Note   Patient Details  Name: Danny Montes MRN: [id number] Date of Birth: [birth year]  Transition of Care [facility]) CM/SW Contact:  Karn Cassis, LCSW Phone Number: 02/18/2020, 12:13 PM   Clinical Narrative:   Pt d/c today home. LCSW received consult for possible medication assistance as pt does not have insurance. LCSW spoke with pt who states he discussed cost with diabetes RN or MD and can afford.     Final next level of care: Home/Self Care Barriers to Discharge: Inadequate or no insurance   Patient Goals and CMS Choice     Choice offered to / list presented to : NA  Discharge Placement                       Discharge Plan and Services In-house Referral: Clinical Social Work Discharge Planning Services: [facility] Program, Other - See comment(Referrals for [facility] and [facility]) Post Acute Care Choice: NA          DME Arranged: N/A DME Agency: NA       HH Arranged: NA HH Agency: NA        Social Determinants of Health (SDOH) Interventions     Readmission Risk Interventions Readmission Risk Prevention Plan 02/17/2020  Post Dischage Appt Not Complete  Appt Comments No longer has PCP; has been referred to [facility]  Medication Screening Complete  Transportation Screening Complete  Some recent data might be hidden

## 2020-02-18 NOTE — Plan of Care (Signed)

## 2020-02-18 NOTE — Progress Notes (Addendum)
Inpatient Diabetes Program Recommendations  AACE/ADA: New Consensus Statement on Inpatient Glycemic Control   Target Ranges:  Prepandial:   less than 140 mg/dL      Peak postprandial:   less than 180 mg/dL (1-2 hours)      Critically ill patients:  140 - 180 mg/dL  Results for Danny Montes, Danny Montes (MRN [id number]) as of 02/18/2020 07:24  Ref. Range 02/18/2020 04:51  Glucose Latest Ref Range: 70 - 99 mg/dL 829 (H)   Results for Danny Montes, Danny Montes (MRN [id number]) as of 02/18/2020 07:24  Ref. Range 02/17/2020 12:49 02/17/2020 13:56 02/17/2020 15:28 02/17/2020 16:24 02/17/2020 16:37 02/17/2020 17:34 02/17/2020 18:36 02/17/2020 21:55  Glucose-Capillary Latest Ref Range: 70 - 99 mg/dL 784 (H) 696 (H) 295 (H) 124 (H) 111 (H) 121 (H) 117 (H)    Lantus 12 units at 18:31 315 (H)  Novolog 4 units  Results for Danny Montes, Danny Montes (MRN [id number]) as of 02/18/2020 07:24  Ref. Range 02/17/2020 08:16  Beta-Hydroxybutyric Acid Latest Ref Range: 0.05 - 0.27 mmol/L 5.37 (H)  Glucose Latest Ref Range: 70 - 99 mg/dL 102 (H)  Hemoglobin V2Z Latest Ref Range: 4.8 - 5.6 % 13.7 (H)   Review of Glycemic Control  Diabetes history: No Outpatient Diabetes medications: NA Current orders for Inpatient glycemic control: Lantus 12 units daily, Novolog 0-15 units TID with meals, Novolog 0-5 units QHS  Inpatient Diabetes Program Recommendations:   Insulin - Basal: Please consider discontinuing Lantus and ordering 70/30 10 units BID to start at 8am today (dose will provide a total of 14 units for basal and 6 units for meal coverage per day).  HgbA1C: A1C 13.7% on 02/17/2020 indicating an average glucose of 346 mg/dl over the past 2-3 months.  NOTE: Since patient does not have any insurance, he will need affordable insulin as an outpatient. Would recommend discharging patient on Novolin 70/30 (which is $25 per vial or $43 per box of 5 insulin pens at [facility]). Will plan to talk with patient again today regarding new DM dx and insulin.  Addendum  02/18/20@12 :20-Spoke with patient over the phone about new diabetes diagnosis.  Discussed A1C results (13.7% on 02/17/2020) and explained what an A1C is and informed patient that his current A1C indicates an average glucose of 346 mg/dl over the past 2-3 months. Discussed basic pathophysiology of DM Type 2, basic home care, importance of checking CBGs and maintaining good CBG control to prevent long-term and short-term complications. Reviewed glucose and A1C goals.  Reviewed signs and symptoms of hyperglycemia and hypoglycemia along with treatment for both. Discussed impact of nutrition, exercise, stress, sickness, and medications on diabetes control. Patient has Living Well with diabetes booklet and was encouraged patient to read through entire book. Informed patient that he will be prescribed Novolin 70/30 since it is more affordable. Informed patient that Novolin 70/30 can be purchased at [facility] for $25 per vial. Provided patient with handout information on Reli-On products and encouraged patient to go to [facility] to get the Reli-On Prime glucometer for $9 and a box of 50 Reli-On test strips for $9. Discussed 70/30 insulin in detail (how to take it, when to take it) and instructed patient he would begin taking it today with supper. Asked patient to check his glucose 4 times per day (before meals and at bedtime) and to keep a log book of glucose readings and insulin taken. Explained how the doctor he follows up with can use the log book to continue to make insulin adjustments if needed.  RN has reviewed and demonstrated how to draw up and administer insulin with vial and syringe. Patient was able to successfully demonstrate to RN how to draw up insulin with vial and syringe. Informed patient that RN will be asking him to self-administer insulin to ensure proper technique and ability to administer self insulin shots. Informed patient that per TOC note on 02/17/2020, a referral has been made to [facility] to assist  with follow up and [facility] voucher would be provided to assist with medications.   Patient verbalized understanding of information discussed and he states that he has no further questions at this time related to diabetes.   RNs to provide ongoing basic DM education at bedside with this patient and engage patient to actively check blood glucose and administer insulin injections.   Thanks, Barnie Alderman, RN, MSN, CDE Diabetes Coordinator Inpatient Diabetes Program [phone] (Team Pager from 8am to 5pm)

## 2020-07-31 ENCOUNTER — Other Ambulatory Visit: Payer: Self-pay

## 2020-07-31 ENCOUNTER — Encounter (HOSPITAL_COMMUNITY): Payer: Self-pay

## 2020-07-31 ENCOUNTER — Emergency Department (HOSPITAL_COMMUNITY)
Admission: EM | Admit: 2020-07-31 | Discharge: 2020-07-31 | Disposition: A | Discharge status: Home / Self Care | Payer: BC Managed Care – PPO | Attending: Emergency Medicine | Admitting: Emergency Medicine

## 2020-07-31 DIAGNOSIS — E119 Type 2 diabetes mellitus without complications: Secondary | ICD-10-CM | POA: Diagnosis not present

## 2020-07-31 DIAGNOSIS — F17210 Nicotine dependence, cigarettes, uncomplicated: Secondary | ICD-10-CM | POA: Diagnosis not present

## 2020-07-31 DIAGNOSIS — R197 Diarrhea, unspecified: Secondary | ICD-10-CM | POA: Insufficient documentation

## 2020-07-31 DIAGNOSIS — Z794 Long term (current) use of insulin: Secondary | ICD-10-CM | POA: Insufficient documentation

## 2020-07-31 DIAGNOSIS — Z20822 Contact with and (suspected) exposure to covid-19: Secondary | ICD-10-CM | POA: Diagnosis not present

## 2020-07-31 LAB — RESPIRATORY PANEL BY RT PCR (FLU A&B, COVID)
Influenza A by PCR: NEGATIVE
Influenza B by PCR: NEGATIVE
SARS Coronavirus 2 by RT PCR: NEGATIVE

## 2020-07-31 LAB — CBG MONITORING, ED: Glucose-Capillary: 104 mg/dL — ABNORMAL HIGH (ref 70–99)

## 2020-07-31 MED ORDER — INSULIN PEN NEEDLE 32G X 4 MM MISC: 1 refills | Status: DC

## 2020-07-31 MED ORDER — NOVOLIN 70/30 FLEXPEN RELION (70-30) 100 UNIT/ML ~~LOC~~ SUPN: 10.0000 [IU] | PEN_INJECTOR | Freq: Two times a day (BID) | SUBCUTANEOUS | 2 refills | Status: DC

## 2020-07-31 MED ORDER — NOVOLIN 70/30 FLEXPEN RELION (70-30) 100 UNIT/ML ~~LOC~~ SUPN: 10.0000 [IU] | PEN_INJECTOR | Freq: Two times a day (BID) | SUBCUTANEOUS | 2 refills | Status: AC

## 2020-07-31 MED ORDER — INSULIN PEN NEEDLE 32G X 4 MM MISC: 1 refills | Status: AC

## 2020-07-31 NOTE — ED Provider Notes (Signed)
[facility] EMERGENCY DEPARTMENT Provider Note   CSN: [id number] Arrival date & time: 07/31/20  2993     History Chief Complaint  Patient presents with  . Diarrhea    Danny Montes is a 49 y.o. male.  Pt presents to the ED today with diarrhea.  He was exposed to Covid on 10/31 and developed diarrhea on 11/1.  He still has some diarrhea.  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  He denies any cough, sob, or fever.  His BS have been good at home.  He has not been vaccinated, but wants to get vaccinated.        Past Medical History:  Diagnosis Date  . Medical history non-contributory     Patient Active Problem List   Diagnosis Date Noted  . New onset type 2 diabetes mellitus (HCC)   . DKA, type 2 (HCC) 02/17/2020  . Tobacco abuse 02/17/2020    Past Surgical History:  Procedure Laterality Date  . NO PAST SURGERIES         No family history on file.  Social History   Tobacco Use  . Smoking status: Current Every Day Smoker    Packs/day: 0.00    Years: 0.00    Pack years: 0.00    Types: Cigarettes  . Smokeless tobacco: Never Used  Vaping Use  . Vaping Use: Never used  Substance Use Topics  . Alcohol use: Yes    Comment: OCCASIONAL  . Drug use: No    Home Medications Prior to Admission medications   Medication Sig Start Date End Date Taking? Authorizing Provider  insulin isophane & regular human (NOVOLIN 70/30 FLEXPEN RELION) (70-30) 100 UNIT/ML KwikPen Inject 10 Units into the skin 2 (two) times daily with a meal. 02/18/20   Tat, Onalee Hua, MD  Insulin Pen Needle 32G X 4 MM MISC Use with insulin pen to dispense insulin as directed 02/18/20   Catarina Hartshorn, MD    Allergies    Patient has no known allergies.  Review of Systems   Review of Systems  Gastrointestinal: Positive for diarrhea.  All other systems reviewed and are negative.   Physical Exam Updated Vital  Signs BP 136/88 (BP Location: Right Arm)   Pulse 79   Temp 98.5 F (36.9 C) (Oral)   Resp 18   Ht 5\' 4"  (1.626 m)   Wt 108.9 kg   SpO2 98%   BMI 41.20 kg/m   Physical Exam Vitals and nursing note reviewed.  Constitutional:      Appearance: Normal appearance.  HENT:     Head: Normocephalic and atraumatic.     Right Ear: External ear normal.     Left Ear: External ear normal.     Nose: Nose normal.     Mouth/Throat:     Mouth: Mucous membranes are moist.     Pharynx: Oropharynx is clear.  Eyes:     Extraocular Movements: Extraocular movements intact.     Conjunctiva/sclera: Conjunctivae normal.     Pupils: Pupils are equal, round, and reactive to light.  Cardiovascular:     Rate and Rhythm: Normal rate and regular rhythm.     Pulses: Normal pulses.     Heart sounds: Normal heart sounds.  Pulmonary:     Effort: Pulmonary effort is normal.     Breath sounds: Normal breath sounds.  Abdominal:     General: Abdomen is flat. Bowel sounds are normal.     Palpations: Abdomen is soft.  Musculoskeletal:        General: Normal range of motion.     Cervical back: Normal range of motion and neck supple.  Skin:    General: Skin is warm.     Capillary Refill: Capillary refill takes less than 2 seconds.  Neurological:     General: No focal deficit present.     Mental Status: He is alert and oriented to person, place, and time.  Psychiatric:        Mood and Affect: Mood normal.        Behavior: Behavior normal.     ED Results / Procedures / Treatments   Labs (all labs ordered are listed, but only abnormal results are displayed) Labs Reviewed  CBG MONITORING, ED - Abnormal; Notable for the following components:      Result Value   Glucose-Capillary 104 (*)    All other components within normal limits  RESPIRATORY PANEL BY RT PCR (FLU A&B, COVID)    EKG None  Radiology No results found.  Procedures Procedures (including critical care time)  Medications Ordered in  ED Medications - No data to display  ED Course  I have reviewed the triage vital signs and the nursing notes.  Pertinent labs & imaging results that were available during my care of the patient were reviewed by me and considered in my medical decision making (see chart for details).    MDM Rules/Calculators/A&P                          BS is good here.  No diarrhea while here.  Pt's Covid test negative.  He is cleared to go back to work.  Danny Montes was evaluated in Emergency Department on 07/31/2020 for the symptoms described in the history of present illness. He was evaluated in the context of the global COVID-19 pandemic, which necessitated consideration that the patient might be at risk for infection with the SARS-CoV-2 virus that causes COVID-19. Institutional protocols and algorithms that pertain to the evaluation of patients at risk for COVID-19 are in a state of rapid change based on information released by regulatory bodies including the CDC and federal and state organizations. These policies and algorithms were followed during the patient's care in the ED. Final Clinical Impression(s) / ED Diagnoses Final diagnoses:  Diarrhea, unspecified type    Rx / DC Orders ED Discharge Orders    None       Jacalyn Lefevre, MD 07/31/20 1211

## 2020-07-31 NOTE — ED Triage Notes (Signed)
Pt reports was exposed to covid on halloween.  Reports started having fever and diarrhea nov 1.   Pt still having diarrhea.  Pt requesting covid test.

## 2023-05-14 ENCOUNTER — Emergency Department (HOSPITAL_COMMUNITY): Payer: BC Managed Care – PPO

## 2023-05-14 ENCOUNTER — Emergency Department (HOSPITAL_COMMUNITY)
Admission: EM | Admit: 2023-05-14 | Discharge: 2023-05-14 | Disposition: A | Discharge status: Home / Self Care | Payer: BC Managed Care – PPO | Attending: Emergency Medicine | Admitting: Emergency Medicine

## 2023-05-14 DIAGNOSIS — S0101XA Laceration without foreign body of scalp, initial encounter: Secondary | ICD-10-CM | POA: Insufficient documentation

## 2023-05-14 DIAGNOSIS — Z23 Encounter for immunization: Secondary | ICD-10-CM | POA: Insufficient documentation

## 2023-05-14 DIAGNOSIS — Z794 Long term (current) use of insulin: Secondary | ICD-10-CM | POA: Insufficient documentation

## 2023-05-14 MED ORDER — TETANUS-DIPHTH-ACELL PERTUSSIS 5-2.5-18.5 LF-MCG/0.5 IM SUSY
0.5000 mL | PREFILLED_SYRINGE | Freq: Once | INTRAMUSCULAR | Status: AC
  Administered 2023-05-14: 0.5 mL via INTRAMUSCULAR
  Filled 2023-05-14: qty 0.5

## 2023-05-14 MED ORDER — LIDOCAINE-EPINEPHRINE-TETRACAINE (LET) TOPICAL GEL
3.0000 mL | Freq: Once | TOPICAL | Status: AC
  Administered 2023-05-14: 3 mL via TOPICAL
  Filled 2023-05-14: qty 3
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
## 2023-05-14 NOTE — Discharge Instructions (Signed)
Keep staples clean and dry. They need to be removed in 1 week-- primary care or urgent care can do this for you. Return here for any new/acute changes.
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
## 2024-05-22 ENCOUNTER — Emergency Department (HOSPITAL_COMMUNITY)
Admission: EM | Admit: 2024-05-22 | Discharge: 2024-05-22 | Disposition: A | Discharge status: Home / Self Care | Payer: MEDICAID | Attending: Emergency Medicine | Admitting: Emergency Medicine

## 2024-05-22 ENCOUNTER — Other Ambulatory Visit: Payer: Self-pay

## 2024-05-22 ENCOUNTER — Encounter (HOSPITAL_COMMUNITY): Payer: Self-pay

## 2024-05-22 ENCOUNTER — Emergency Department (HOSPITAL_COMMUNITY): Payer: MEDICAID

## 2024-05-22 DIAGNOSIS — W450XXA Nail entering through skin, initial encounter: Secondary | ICD-10-CM | POA: Diagnosis not present

## 2024-05-22 DIAGNOSIS — M7989 Other specified soft tissue disorders: Secondary | ICD-10-CM | POA: Insufficient documentation

## 2024-05-22 DIAGNOSIS — Z794 Long term (current) use of insulin: Secondary | ICD-10-CM | POA: Diagnosis not present

## 2024-05-22 DIAGNOSIS — S91331A Puncture wound without foreign body, right foot, initial encounter: Secondary | ICD-10-CM | POA: Diagnosis not present

## 2024-05-22 DIAGNOSIS — E1110 Type 2 diabetes mellitus with ketoacidosis without coma: Secondary | ICD-10-CM | POA: Insufficient documentation

## 2024-05-22 DIAGNOSIS — F17200 Nicotine dependence, unspecified, uncomplicated: Secondary | ICD-10-CM | POA: Diagnosis not present

## 2024-05-22 DIAGNOSIS — S99921A Unspecified injury of right foot, initial encounter: Secondary | ICD-10-CM | POA: Diagnosis present

## 2024-05-22 MED ORDER — ACETAMINOPHEN 325 MG PO TABS
650.0000 mg | ORAL_TABLET | Freq: Once | ORAL | Status: AC
  Administered 2024-05-22: 650 mg via ORAL
  Filled 2024-05-22: qty 2

## 2024-05-22 MED ORDER — AMOXICILLIN-POT CLAVULANATE 875-125 MG PO TABS: 1.0000 | ORAL_TABLET | Freq: Two times a day (BID) | ORAL | 0 refills | Status: AC

## 2024-05-22 MED ORDER — AMOXICILLIN-POT CLAVULANATE 875-125 MG PO TABS
1.0000 | ORAL_TABLET | Freq: Once | ORAL | Status: AC
  Administered 2024-05-22: 1 via ORAL
  Filled 2024-05-22: qty 1

## 2024-05-22 NOTE — ED Provider Notes (Signed)
 [facility] Provider Note   CSN: [id number] Arrival date & time: 05/22/24  0845     Patient presents with: No chief complaint on file.   Danny Montes is a 53 y.o. male who presents to the emergency department with a chief complaint of right foot pain.  Patient states that he was outside and was attempting to break a wooden board when he stomped on the board and accidentally stepped on a nail.  Patient states that he had on rubber soled shoes and that the nail went into his foot.  Patient states that out of reflex he jerked backwards and that the nail removed from his foot.  Patient states that he immediately had bleeding to the area.  States that he is having severe pain when attempting to walk.  Bleeding currently controlled. Past medical history significant for type 2 diabetes, DKA, tobacco abuse.  Patient denies peripheral neuropathy.  Denies fever, chills.   HPI     Prior to Admission medications   Medication Sig Start Date End Date Taking? Authorizing Provider  amoxicillin -clavulanate (AUGMENTIN ) 875-125 MG tablet Take 1 tablet by mouth every 12 (twelve) hours for 5 days. 05/22/24 05/27/24 Yes Zvi Duplantis F, PA-C  insulin  isophane & regular human (NOVOLIN  70/30 FLEXPEN RELION) (70-30) 100 UNIT/ML KwikPen Inject 10 Units into the skin 2 (two) times daily with a meal. 07/31/20   Dean Clarity, MD  Insulin  Pen Needle 32G X 4 MM MISC Use with insulin  pen to dispense insulin  as directed 07/31/20   Dean Clarity, MD    Allergies: Patient has no known allergies.    Review of Systems  Skin:  Positive for wound (R foot puncture wound).    Updated Vital Signs BP 126/80 (BP Location: Right Arm)   Pulse 77   Temp 97.9 F (36.6 C) (Oral)   Resp 16   SpO2 100%   Physical Exam Vitals and nursing note reviewed.  Constitutional:      General: He is awake. He is not in acute distress.    Appearance: Normal appearance. He is not  ill-appearing, toxic-appearing or diaphoretic.  HENT:     Head: Normocephalic and atraumatic.  Eyes:     General: No scleral icterus. Pulmonary:     Effort: Pulmonary effort is normal. No respiratory distress.  Musculoskeletal:     Right lower leg: No edema.     Left lower leg: No edema.     Comments: Patient able to plantar flex and dorsi flex R ankle, RLE neurovascularly intact, DP pulse 2+  Pain with palpation of dorsal heel area  Skin:    General: Skin is warm.     Capillary Refill: Capillary refill takes less than 2 seconds.     Comments: Dried blood present to right foot, puncture wound site present to plantar heel of R foot, no active drainage, surrounding redness, or erythema  No appreciated fluctuance  Neurological:     General: No focal deficit present.     Mental Status: He is alert and oriented to person, place, and time.  Psychiatric:        Mood and Affect: Mood normal.        Behavior: Behavior normal. Behavior is cooperative.     (all labs ordered are listed, but only abnormal results are displayed) Labs Reviewed - No data to display  EKG: None  Radiology: DG Foot Complete Right Result Date: 05/22/2024 CLINICAL DATA:  Stepped on nail EXAM: RIGHT  FOOT COMPLETE - 3 VIEW COMPARISON:  None Available. FINDINGS: There is no evidence of fracture or dislocation. Soft tissues are unremarkable. No radiopaque foreign body. IMPRESSION: No acute fracture or dislocation. No radiopaque foreign body. Electronically Signed   By: Limin  Xu M.D.   On: 05/22/2024 10:09     Procedures   Medications Ordered in the ED  acetaminophen  (TYLENOL ) tablet 650 mg (650 mg Oral Given 05/22/24 0939)  amoxicillin -clavulanate (AUGMENTIN ) 875-125 MG per tablet 1 tablet (1 tablet Oral Given 05/22/24 1022)                                    Medical Decision Making Amount and/or Complexity of Data Reviewed Radiology: ordered.  Risk OTC drugs. Prescription drug management.   Patient  presents to the ED for concern of right foot pain, this involves an extensive number of treatment options, and is a complaint that carries with it a high risk of complications and morbidity.  The differential diagnosis includes foreign body, soft tissue injury, infection, abscess, diabetic foot infection, fracture, ligament/tendon injury, etc.   Co morbidities that complicate the patient evaluation   type 2 diabetes, DKA, tobacco abuse   Imaging Studies ordered:  I ordered imaging studies including complete x-ray of right foot I independently visualized and interpreted imaging which showed no acute fracture or dislocation, no foreign body I agree with the radiologist interpretation   Medicines ordered and prescription drug management:  I ordered medication including Tylenol  and Augmentin  for pain and antibiotic prophylaxis Reevaluation of the patient after these medicines showed that the patient improved I have reviewed the patients home medicines and have made adjustments as needed   Test Considered:  Lab work including infectious workup: Declined at this time as patient is afebrile, vital signs are stable, no appreciated fluctuance surrounding site, erythema or drainage from the wound site, low clinical suspicion for infection at this time, will prophylactically cover with antibiotics   Critical Interventions:  None   Consultations Obtained:  I requested consultation with the pharmacy,  and discussed lab and imaging findings as well as pertinent plan - they recommend: Treatment with Augmentin  for bacterial prophylaxis instead of ciprofloxacin due to patient age   Problem List / ED Course:  53 year old male, history of type 2 diabetes, puncture wound from nail to right lower extremity, up-to-date on tetanus per chart review with last tetanus being received in 04/2023, presenting for right lower extremity pain after puncture wound Patient denies taking any at home pain  medications for relief On physical exam right foot has dried blood present, puncture like wound present to plantar surface of right heel, no active drainage, no surrounding erythema, very tender to touch however no fluctuance, low clinical suspicion for abscess or underlying infection at this time Right foot cleaned including wound site and rebandaged Normal range of motion of right foot, right lower extremity neurovascularly intact Will obtain x-ray of right foot to rule out possible foreign body, xray negative  Patient has significant discomfort with weight-bearing so crutches ordered, will provide antibiotic prophylaxis outpatient with Augmentin , low clinical suspicion for infection at this time based off of physical exam and imaging, patient states that he does have severe pain when putting weight on his heel area Patient pain improved after Tylenol  Return precautions given, patient able to ambulate with crutches prior to discharge Patient discharged Most likely diagnosis at this time is puncture wound present  to right heel, tetanus already updated, antibiotic prophylaxis offered, return precautions given Patient does not have a primary care provider so instructed with strict return precautions to return to the emergency department if symptoms of infection develop   Reevaluation:  After the interventions noted above, I reevaluated the patient and found that they have :improved   Social Determinants of Health:  No PCP   Dispostion:  After consideration of the diagnostic results and the patients response to treatment, I feel that the patent would benefit from discharge and outpatient therapy as described.  Return to the emergency department if symptoms worsen or persist.     Final diagnoses:  Puncture wound of right foot, initial encounter    ED Discharge Orders          Ordered    amoxicillin -clavulanate (AUGMENTIN ) 875-125 MG tablet  Every 12 hours        05/22/24 1356                [place], Saja Bartolini F, PA-C 05/22/24 1626    Elnor Bernarda SQUIBB, DO 06/07/24 [phone]

## 2024-05-22 NOTE — ED Notes (Signed)
Pt provided lunch bag and drink.

## 2024-05-22 NOTE — ED Triage Notes (Signed)
 Patient arrived by [facility] after stepping on nail last night and removing same from right heel. Pain with ambulation

## 2024-05-22 NOTE — Discharge Instructions (Addendum)
 It was a pleasure taking care of you today.  Based on your history, physical exam, and imaging I feel you are safe for discharge.  Today on your x-ray no foreign body was noted.  You have been given a prescription for Augmentin  which is an antibiotic to try and prevent a possible bacterial infection from forming. Please take as instructed and complete the entire course. Please return to the emergency department if experiencing the following symptoms including but not limited to fever, chills, severe foot pain, signs of infection including redness, drainage, swelling, trouble walking, or other concerning symptom.  You have been given crutches, please use for comfort when walking.  Please take Tylenol  for help with pain.  The max daily dose of Tylenol  is 4000 mg/day.  Please do not exceed this daily limit. Please keep the wound area clean and dry. You may cover it with a band-aid as well. Please do not soak your foot, but you may shower. If symptoms worsen recommend wound re-check in 48 hours either here, with primary care, or by urgent care.

## 2024-05-22 NOTE — ED Notes (Signed)
Ortho tech en route 

## 2024-05-22 NOTE — Progress Notes (Signed)
 Orthopedic Tech Progress Note Patient Details:  Danny Montes 1970/09/27 [id number]  Ortho Devices Type of Ortho Device: Crutches Ortho Device/Splint Interventions: Ordered, Adjustment      Massie BRAVO Lorine Iannaccone 05/22/2024, 1:51 PM
